# Patient Record
Sex: Female | Born: 1945
Health system: Southern US, Community
[De-identification: ages and names within clinical notes are randomized; demographics above are authoritative.]

## PROBLEM LIST (undated history)

## (undated) DIAGNOSIS — I10 Essential (primary) hypertension: Secondary | ICD-10-CM

## (undated) DIAGNOSIS — R42 Dizziness and giddiness: Secondary | ICD-10-CM

## (undated) DIAGNOSIS — I714 Abdominal aortic aneurysm, without rupture, unspecified: Secondary | ICD-10-CM

## (undated) DIAGNOSIS — Z720 Tobacco use: Secondary | ICD-10-CM

## (undated) DIAGNOSIS — M810 Age-related osteoporosis without current pathological fracture: Secondary | ICD-10-CM

## (undated) DIAGNOSIS — K219 Gastro-esophageal reflux disease without esophagitis: Secondary | ICD-10-CM

## (undated) DIAGNOSIS — E785 Hyperlipidemia, unspecified: Secondary | ICD-10-CM

## (undated) DIAGNOSIS — J449 Chronic obstructive pulmonary disease, unspecified: Secondary | ICD-10-CM

## (undated) HISTORY — PX: ABDOMINAL AORTIC ANEURYSM REPAIR: SUR1152

## (undated) HISTORY — DX: Hyperlipidemia, unspecified: E78.5

## (undated) HISTORY — DX: Dizziness and giddiness: R42

## (undated) HISTORY — DX: Age-related osteoporosis without current pathological fracture: M81.0

## (undated) HISTORY — DX: Tobacco use: Z72.0

## (undated) HISTORY — DX: Essential (primary) hypertension: I10

## (undated) HISTORY — DX: Chronic obstructive pulmonary disease, unspecified: J44.9

## (undated) HISTORY — DX: Gastro-esophageal reflux disease without esophagitis: K21.9

## (undated) HISTORY — DX: Abdominal aortic aneurysm, without rupture, unspecified: I71.40

## (undated) HISTORY — DX: Abdominal aortic aneurysm, without rupture: I71.4

---

## 2000-12-13 ENCOUNTER — Ambulatory Visit (HOSPITAL_COMMUNITY): Admission: RE | Admit: 2000-12-13 | Discharge: 2000-12-13 | Payer: Self-pay | Admitting: Neurosurgery

## 2000-12-13 ENCOUNTER — Encounter: Payer: Self-pay | Admitting: Neurosurgery

## 2001-01-08 ENCOUNTER — Encounter (HOSPITAL_COMMUNITY): Admission: RE | Admit: 2001-01-08 | Discharge: 2001-02-07 | Payer: Self-pay | Admitting: Neurosurgery

## 2001-06-04 ENCOUNTER — Encounter: Payer: Self-pay | Admitting: Neurosurgery

## 2001-06-04 ENCOUNTER — Ambulatory Visit (HOSPITAL_COMMUNITY): Admission: RE | Admit: 2001-06-04 | Discharge: 2001-06-04 | Payer: Self-pay | Admitting: Neurosurgery

## 2005-05-02 ENCOUNTER — Ambulatory Visit (HOSPITAL_COMMUNITY): Admission: RE | Admit: 2005-05-02 | Discharge: 2005-05-02 | Payer: Self-pay | Admitting: Ophthalmology

## 2005-05-23 ENCOUNTER — Ambulatory Visit (HOSPITAL_COMMUNITY): Admission: RE | Admit: 2005-05-23 | Discharge: 2005-05-23 | Payer: Self-pay | Admitting: Ophthalmology

## 2007-05-08 ENCOUNTER — Ambulatory Visit: Payer: Self-pay | Admitting: Cardiology

## 2008-01-15 ENCOUNTER — Ambulatory Visit: Payer: Self-pay | Admitting: Vascular Surgery

## 2009-01-20 ENCOUNTER — Ambulatory Visit: Payer: Self-pay | Admitting: Vascular Surgery

## 2010-01-26 ENCOUNTER — Ambulatory Visit: Payer: Self-pay | Admitting: Vascular Surgery

## 2010-10-08 ENCOUNTER — Inpatient Hospital Stay (HOSPITAL_COMMUNITY)
Admission: RE | Admit: 2010-10-08 | Discharge: 2010-10-17 | DRG: 238 | Disposition: A | Discharge status: Home Health | Payer: MEDICARE | Attending: Vascular Surgery | Admitting: Vascular Surgery

## 2010-10-08 DIAGNOSIS — I714 Abdominal aortic aneurysm, without rupture, unspecified: Principal | ICD-10-CM | POA: Diagnosis present

## 2010-10-08 DIAGNOSIS — K219 Gastro-esophageal reflux disease without esophagitis: Secondary | ICD-10-CM | POA: Diagnosis present

## 2010-10-08 DIAGNOSIS — F411 Generalized anxiety disorder: Secondary | ICD-10-CM | POA: Diagnosis present

## 2010-10-08 DIAGNOSIS — K560 Paralytic ileus: Secondary | ICD-10-CM | POA: Diagnosis present

## 2010-10-08 DIAGNOSIS — E876 Hypokalemia: Secondary | ICD-10-CM | POA: Diagnosis not present

## 2010-10-08 DIAGNOSIS — M549 Dorsalgia, unspecified: Secondary | ICD-10-CM | POA: Diagnosis present

## 2010-10-08 DIAGNOSIS — E785 Hyperlipidemia, unspecified: Secondary | ICD-10-CM | POA: Diagnosis present

## 2010-10-08 DIAGNOSIS — N390 Urinary tract infection, site not specified: Secondary | ICD-10-CM | POA: Diagnosis present

## 2010-10-08 DIAGNOSIS — I959 Hypotension, unspecified: Secondary | ICD-10-CM | POA: Diagnosis present

## 2010-10-08 LAB — ABO/RH: ABO/RH(D): O POS

## 2010-10-08 LAB — COMPREHENSIVE METABOLIC PANEL
AST: 28 U/L (ref 0–37)
BUN: 10 mg/dL (ref 6–23)
CO2: 22 mEq/L (ref 19–32)
Calcium: 8 mg/dL — ABNORMAL LOW (ref 8.4–10.5)
Chloride: 106 mEq/L (ref 96–112)
Creatinine, Ser: 0.9 mg/dL (ref 0.4–1.2)
GFR calc Af Amer: >60 mL/min (ref >60)
GFR calc non Af Amer: >60 mL/min (ref >60)
Glucose, Bld: 188 mg/dL — ABNORMAL HIGH (ref 70–99)
Total Bilirubin: 0.8 mg/dL (ref 0.3–1.2)

## 2010-10-08 LAB — DIFFERENTIAL
Basophils Absolute: 0 10*3/uL (ref 0.0–0.1)
Basophils Relative: 0 % (ref 0–1)
Eosinophils Absolute: 0.3 10*3/uL (ref 0.0–0.7)
Eosinophils Relative: 3 % (ref 0–5)
Lymphocytes Relative: 27 % (ref 12–46)
Lymphs Abs: 2.5 10*3/uL (ref 0.7–4.0)
Monocytes Absolute: 0.6 10*3/uL (ref 0.1–1.0)
Monocytes Relative: 6 % (ref 3–12)
Neutro Abs: 5.9 10*3/uL (ref 1.7–7.7)
Neutrophils Relative %: 64 % (ref 43–77)

## 2010-10-08 LAB — CBC
HCT: 44.1 % (ref 36.0–46.0)
HCT: 38.8 % (ref 36.0–46.0)
Hemoglobin: 15 g/dL (ref 12.0–15.0)
Hemoglobin: 12.9 g/dL (ref 12.0–15.0)
MCH: 30.4 pg (ref 26.0–34.0)
MCH: 30.2 pg (ref 26.0–34.0)
MCHC: 34 g/dL (ref 30.0–36.0)
MCHC: 33.2 g/dL (ref 30.0–36.0)
MCV: 89.5 fL (ref 78.0–100.0)
MCV: 90.9 fL (ref 78.0–100.0)
Platelets: 201 10*3/uL (ref 150–400)
RBC: 4.93 MIL/uL (ref 3.87–5.11)
RBC: 4.27 MIL/uL (ref 3.87–5.11)
RDW: 13 % (ref 11.5–15.5)
WBC: 9.3 10*3/uL (ref 4.0–10.5)

## 2010-10-08 LAB — GLUCOSE, CAPILLARY: Glucose-Capillary: 187 mg/dL — ABNORMAL HIGH (ref 70–99)

## 2010-10-08 LAB — BASIC METABOLIC PANEL
CO2: 24 mEq/L (ref 19–32)
Calcium: 9 mg/dL (ref 8.4–10.5)
Chloride: 109 mEq/L (ref 96–112)
GFR calc Af Amer: >60 mL/min (ref >60)
Glucose, Bld: 106 mg/dL — ABNORMAL HIGH (ref 70–99)
Sodium: 139 mEq/L (ref 135–145)

## 2010-10-08 LAB — APTT: aPTT: 39 seconds — ABNORMAL HIGH (ref 24–37)

## 2010-10-08 LAB — PROTIME-INR
INR: 1.06 (ref 0.00–1.49)
Prothrombin Time: 14 seconds (ref 11.6–15.2)

## 2010-10-08 LAB — MRSA PCR SCREENING: MRSA by PCR: NEGATIVE

## 2010-10-09 LAB — COMPREHENSIVE METABOLIC PANEL
ALT: 19 U/L (ref 0–35)
AST: 22 U/L (ref 0–37)
Albumin: 2.7 g/dL — ABNORMAL LOW (ref 3.5–5.2)
Calcium: 7.8 mg/dL — ABNORMAL LOW (ref 8.4–10.5)
Chloride: 105 mEq/L (ref 96–112)
Creatinine, Ser: 0.79 mg/dL (ref 0.4–1.2)
GFR calc Af Amer: >60 mL/min (ref >60)
Sodium: 134 mEq/L — ABNORMAL LOW (ref 135–145)
Total Bilirubin: 0.6 mg/dL (ref 0.3–1.2)

## 2010-10-09 LAB — GLUCOSE, CAPILLARY
Glucose-Capillary: 170 mg/dL — ABNORMAL HIGH (ref 70–99)
Glucose-Capillary: 125 mg/dL — ABNORMAL HIGH (ref 70–99)
Glucose-Capillary: 123 mg/dL — ABNORMAL HIGH (ref 70–99)

## 2010-10-09 LAB — CBC
Hemoglobin: 12.4 g/dL (ref 12.0–15.0)
MCH: 30.2 pg (ref 26.0–34.0)
Platelets: 128 10*3/uL — ABNORMAL LOW (ref 150–400)
RBC: 4.11 MIL/uL (ref 3.87–5.11)

## 2010-10-10 LAB — GLUCOSE, CAPILLARY
Glucose-Capillary: 149 mg/dL — ABNORMAL HIGH (ref 70–99)
Glucose-Capillary: 140 mg/dL — ABNORMAL HIGH (ref 70–99)
Glucose-Capillary: 140 mg/dL — ABNORMAL HIGH (ref 70–99)

## 2010-10-11 LAB — CBC
HCT: 31.8 % — ABNORMAL LOW (ref 36.0–46.0)
Hemoglobin: 10.5 g/dL — ABNORMAL LOW (ref 12.0–15.0)
MCH: 30.3 pg (ref 26.0–34.0)
MCHC: 33 g/dL (ref 30.0–36.0)
MCV: 91.9 fL (ref 78.0–100.0)

## 2010-10-11 LAB — BASIC METABOLIC PANEL
CO2: 26 mEq/L (ref 19–32)
Chloride: 102 mEq/L (ref 96–112)
GFR calc non Af Amer: >60 mL/min (ref >60)
Glucose, Bld: 133 mg/dL — ABNORMAL HIGH (ref 70–99)
Potassium: 4.6 mEq/L (ref 3.5–5.1)
Sodium: 134 mEq/L — ABNORMAL LOW (ref 135–145)

## 2010-10-11 LAB — GLUCOSE, CAPILLARY
Glucose-Capillary: 119 mg/dL — ABNORMAL HIGH (ref 70–99)
Glucose-Capillary: 122 mg/dL — ABNORMAL HIGH (ref 70–99)
Glucose-Capillary: 136 mg/dL — ABNORMAL HIGH (ref 70–99)

## 2010-10-12 LAB — CROSSMATCH
ABO/RH(D): O POS
Antibody Screen: NEGATIVE
Unit division: 0
Unit division: 0
Unit division: 0
Unit division: 0
Unit division: 0
Unit division: 0
Unit division: 0
Unit division: 0

## 2010-10-12 LAB — GLUCOSE, CAPILLARY
Glucose-Capillary: 146 mg/dL — ABNORMAL HIGH (ref 70–99)
Glucose-Capillary: 155 mg/dL — ABNORMAL HIGH (ref 70–99)

## 2010-10-13 LAB — GLUCOSE, CAPILLARY: Glucose-Capillary: 113 mg/dL — ABNORMAL HIGH (ref 70–99)

## 2010-10-13 LAB — URINE MICROSCOPIC-ADD ON

## 2010-10-13 LAB — URINALYSIS, ROUTINE W REFLEX MICROSCOPIC
Specific Gravity, Urine: 1.006 (ref 1.005–1.030)
Urine Glucose, Fasting: NEGATIVE mg/dL

## 2010-10-14 LAB — GLUCOSE, CAPILLARY
Glucose-Capillary: 108 mg/dL — ABNORMAL HIGH (ref 70–99)
Glucose-Capillary: 109 mg/dL — ABNORMAL HIGH (ref 70–99)
Glucose-Capillary: 112 mg/dL — ABNORMAL HIGH (ref 70–99)
Glucose-Capillary: 140 mg/dL — ABNORMAL HIGH (ref 70–99)

## 2010-10-15 LAB — GLUCOSE, CAPILLARY
Glucose-Capillary: 108 mg/dL — ABNORMAL HIGH (ref 70–99)
Glucose-Capillary: 124 mg/dL — ABNORMAL HIGH (ref 70–99)
Glucose-Capillary: 128 mg/dL — ABNORMAL HIGH (ref 70–99)
Glucose-Capillary: 131 mg/dL — ABNORMAL HIGH (ref 70–99)
Glucose-Capillary: 138 mg/dL — ABNORMAL HIGH (ref 70–99)

## 2010-10-15 LAB — URINALYSIS, MICROSCOPIC ONLY
Bilirubin Urine: NEGATIVE
Hgb urine dipstick: NEGATIVE
Protein, ur: NEGATIVE mg/dL
Urobilinogen, UA: 4 mg/dL — ABNORMAL HIGH (ref 0.0–1.0)

## 2010-10-16 LAB — CBC
Hemoglobin: 10.5 g/dL — ABNORMAL LOW (ref 12.0–15.0)
RBC: 3.51 MIL/uL — ABNORMAL LOW (ref 3.87–5.11)
WBC: 7.3 10*3/uL (ref 4.0–10.5)

## 2010-10-16 LAB — GLUCOSE, CAPILLARY
Glucose-Capillary: 117 mg/dL — ABNORMAL HIGH (ref 70–99)
Glucose-Capillary: 117 mg/dL — ABNORMAL HIGH (ref 70–99)
Glucose-Capillary: 129 mg/dL — ABNORMAL HIGH (ref 70–99)

## 2010-10-16 LAB — BASIC METABOLIC PANEL
CO2: 22 mEq/L (ref 19–32)
Calcium: 8.5 mg/dL (ref 8.4–10.5)
Chloride: 108 mEq/L (ref 96–112)
GFR calc Af Amer: >60 mL/min (ref >60)
Potassium: 2.9 mEq/L — ABNORMAL LOW (ref 3.5–5.1)
Sodium: 140 mEq/L (ref 135–145)

## 2010-10-17 LAB — URINE CULTURE: Culture  Setup Time: 201202041709

## 2010-10-17 LAB — BASIC METABOLIC PANEL
CO2: 22 mEq/L (ref 19–32)
Calcium: 8.8 mg/dL (ref 8.4–10.5)
Creatinine, Ser: 0.85 mg/dL (ref 0.4–1.2)
GFR calc Af Amer: >60 mL/min (ref >60)

## 2010-10-19 NOTE — Discharge Summary (Addendum)
NAMECURRY, [name]                ACCOUNT NO.:  [id number]  MEDICAL RECORD NO.:  [id number]           PATIENT TYPE:  I  LOCATION:  2010                         FACILITY:  [facility]  PHYSICIAN:  [name], M.D.    DATE OF BIRTH:  12/22/1945  DATE OF ADMISSION:  10/08/2010 DATE OF DISCHARGE:  10/17/2010                              DISCHARGE SUMMARY   ADMITTING DIAGNOSIS:  Symptomatic abdominal aortic aneurysm.  PAST MEDICAL HISTORY AND DISCHARGE DIAGNOSES: 1. Gastroesophageal reflux disease. 2. Hyperlipidemia. 3. Anxiety. 4. Symptomatic abdominal aortic aneurysm, status post abdominal aortic     aneurysm repair with a 16-mm Hemashield graft. 5. History of C-section. 6. History of cholecystectomy. 7. History of lumbar surgery. 8. Urinary tract infection, [name], sensitive to Cipro.  ALLERGIES:  No known drug allergies.  BRIEF HISTORY:  The patient is a 65 year old Caucasian female with a known small infrarenal abdominal aortic aneurysm followed by serial ultrasounds.  She presented to [facility] on October 08, 2010, with severe progressive back pain and a CT scan showing a 5.3-cm aneurysm.  Due to the rapid expansion and abdominal tenderness on exam, it was recommended the patient undergo urgent surgical repair.  There was no evidence of leak at that time.  HOSPITAL COURSE:  The patient was admitted to the emergency room secondary to abdominal pain and an expanding aneurysm as previously stated.  The patient was taken to the OR on October 08, 2010 for resection and grafting of abdominal aortic aneurysm with a 16-mm Hemashield graft.  The patient tolerated the procedure well and was hemodynamically stable immediately postoperatively.  The patient was transferred from the OR to the SICU in stable condition.  The patient was extubated without complication and woke up from anesthesia neurologically intact.  The patient's postoperative course has progressed  as expected with the exception of postoperative ileus.  She has ambulated well and without difficulty.  Her diet has been slowly advanced.  She did have several episodes of emesis initially and the diet was reverted back to liquids only.  She eventually began to have bowel movements with the aid of suppositories and milk of magnesia.  Her ileus has improved and she is currently tolerating a regular diet again.  The patient does have a urinary tract infection as well.  The culture was positive and she is being treated with culture-appropriate Cipro.  She has been asymptomatic from this standpoint.   On postop day #9 the patient is without complaint.  She is ambulating well and tolerating a regular diet.  She has had multiple bowel movements.  She is afebrile with stable vital signs. Physical exam:  cardiac is regular rate and rhythm, lungs are clear to auscultation, abdomen is soft with active  bowel sounds, the incision is clean, dry, and intact. There are 2+ posterior tibial pulses present bilaterally.    The patient is doing well at this time.  She will need to complete a 7-day course of Cipro for her urinary tract infection.  The patient did have hypokalemia yesterday.  As long as her repeat potassium level is within normal limits  she will be stable for discharge home at this time.  CBC and BMP on October 16, 2010:  White count 7.3, hemoglobin 10.5, platelets 265, sodium 140, potassium 2.9, BUN 3, and creatinine 0.78.  The patient received specific written discharge instructions regarding diet, activity, and wound care.  She is to call the office with any questions issues or problems at [phone].  The patient will have a followup appointment with Dr. [name] in approximately 2 weeks.  The office will contact the patient with date and time of the appointment.  DISCHARGE MEDICATIONS: 1. Cipro 500 mg 1 p.o. daily for an additional 5 days. 2. Hydrocodone/acetaminophen 5/325 mg 1-2 q.4-6  h. p.r.n. pain. 3. Diazepam 10 mg 1-1/2 tablets p.o. nightly p.r.n. 4. Ranitidine 150 mg 1 p.o. b.i.d. 5. Simvastatin 20 mg 1 p.o. nightly. 6. Vitamin D OTC 1 p.o. daily.     [name], PA   ______________________________ [name], M.D.    AY/MEDQ  D:  10/17/2010  T:  10/17/2010  Job:  [id number]  Electronically Signed by [name] PA on 10/18/2010 01:24:56 PM Electronically Signed by [name] M.D. on 10/19/2010 11:24:48 AM

## 2010-10-19 NOTE — H&P (Addendum)
[name]                ACCOUNT NO.:  [id number]  MEDICAL RECORD NO.:  [id number]          PATIENT TYPE:  INP  LOCATION:  2305                         FACILITY:  [facility]  PHYSICIAN:  [name], M.D.    DATE OF BIRTH:  March 05, 1946  DATE OF ADMISSION:  10/08/2010 DATE OF DISCHARGE:                             HISTORY & PHYSICAL   ADMISSION DIAGNOSIS:  Symptomatic abdominal aortic aneurysm.  HISTORY OF PRESENT ILLNESS:  [name] is a 65 year old white female with a known history of infrarenal abdominal aortic aneurysm.  She presented to the [facility] morning of this admission with increasingly severe back pain.  This had been progressive over the past 2 weeks and got to the point where she was unable to tolerate this and presented to the emergency room.  Evaluation at [facility] Emergency Department included a CAT scan, which showed her aneurysm to be 5.3 cm with no evidence of leak.  This began just below the level of renal arteries and ended at the aortic bifurcation.  She had been seen in our office by Dr. Darrick Penna in 2009 and had had serial ultrasound exam since that time.  In May 2011, her aneurysm size was 3.9 cm and had been 3.7 cm on the prior exam 1 year earlier.  Her past history is negative for cardiac disease.  She does have a history of gastroesophageal reflux disease, hyperlipidemia and anxiety.  SOCIAL HISTORY:  She does smoke one pack per day cigarettes.  PAST SURGICAL HISTORY:  Positive for C-section, cholecystectomy, and lumbar surgery 10 years ago.  ALLERGIES:  No known drug allergies.  REVIEW OF SYSTEMS:  Documented in her ER chart and is negative aside from HPI.  FAMILY HISTORY:  Negative for abdominal aortic aneurysm.  PHYSICAL EXAMINATION:  GENERAL:  Well-developed, well-nourished white female in mild distress from back pain.  She is alert and oriented. VITAL SIGNS:  Blood pressure 152/85, pulse 88, respirations 18, her oxygen  saturations are 95% on 2 liters nasal cannula. HEENT:  Normal. CHEST:  Clear bilaterally. HEART:  Regular rate and rhythm without murmur.  Her radial, femoral, popliteal and dorsalis pedis pulses are all 2+ and equal. ABDOMEN:  Reveals easily palpable aneurysm.  She is tender specifically over the aneurysm.  No other masses are noted. EXTREMITIES:  No cyanosis, clubbing or edema.  No major deformities. NEUROLOGIC:  Neurologically, she is grossly intact. SKIN:  Without ulcerations or rashes.  LABORATORY DATA:  Creatinine is 0.9.  Hemoglobin and hematocrit are 14 and 42.  EKG is normal sinus rhythm.  CT scan was reviewed, this did show a aneurysm beginning just below the level of the renal arteries extending to the bifurcation and is 5.3 cm with irregular border.  There is no evidence of leak.  I had a long discussion with the patient and her family present, I explained that she has a large aneurysm with very rapid growth of 1.4 cm over the past 8 months, this an association with her worsening, unrelenting back pain and tenderness over the aneurysm warrants urgent repair.  I have recommend that we proceed to  the operating room this morning from the ER for urgent open repair.  I explained the option of stent grafting and if this was not possible for her due to the aneurysm beginning just below the takeoff of the renal arteries, I explained the procedure of open aneurysm repair with a predicted less than 5% mortality from the surgery and a 10-15% major morbidity.  I explained expected hospitalization of 1-2 days an intensive care and expected 5-7 days hospitalizations if she does not have complications.  She and her family understand and wish to proceed with urgent surgery today.     [name], M.D.     TFE/MEDQ  D:  10/08/2010  T:  10/08/2010  Job:  [id number]  Electronically Signed by Roosevelt Bisher M.D. on 10/19/2010 11:24:33 AM

## 2010-10-19 NOTE — Op Note (Addendum)
[name], [name]                ACCOUNT NO.:  [id number]  MEDICAL RECORD NO.:  [id number]          PATIENT TYPE:  INP  LOCATION:  2305                         FACILITY:  [facility]  PHYSICIAN:  [name], M.D.    DATE OF BIRTH:  [birth year]  DATE OF PROCEDURE:  10/08/2010 DATE OF DISCHARGE:                              OPERATIVE REPORT   PREOPERATIVE DIAGNOSIS:  Symptomatic infrarenal abdominal aortic aneurysm.  POSTOPERATIVE DIAGNOSIS:  Symptomatic infrarenal abdominal aortic aneurysm.  PROCEDURE:  Resection grafting of abdominal aortic aneurysm with a 16-mm Hemashield graft.  SURGEON:  [name], MD  ASSISTANT:  Nurse  ANESTHESIA:  General endotracheal.  COMPLICATIONS:  None.  Discharged to recovery room stable.  INDICATION FOR THE PROCEDURE:  The patient is a 65 year old female with a known small infrarenal abdominal aortic aneurysm followed with serial ultrasound.  She presented to [facility] with severe progressive back pain, CT scan showing a 5.3-cm aneurysm.  Due to the rapid expansion and due to abdominal tenderness on physical exam, it was recommended that she undergo urgent repair.  There was no evidence of leak.  PROCEDURE IN DETAIL:  The patient was taken to the operating room, placed in supine position where the area of abdomen and both groins was prepped and draped in usual sterile fashion.  Incision was made from the level of xiphoid to below the umbilicus and carried down through the midline fascia with electrocautery.  The patient had a prior open cholecystectomy, and there were adhesions to the anterior abdominal wall on the right and these were all lysed.  Transverse colon and omentum were reflected superiorly and the small bowel was reflected to the right.  Omni-Tract retractor was used for exposure.  The retroperitoneum was opened.  The duodenum was mobilized off the aorta.  The aorta was encircled just below the level of the renal  arteries.  Renal arteries were identified on the right and left.  Dissection was carried down to the bifurcation of the aorta, and there was no evidence of aneurysm extending into the iliac arteries.  The common iliac arteries were encircled bilaterally.  The inferior mesenteric artery was controlled. The patient was given 25 g of mannitol and 8000 units of intravenous heparin.  After adequate circulation time, the aorta was occluded with a Harken clamp just below the level of the renal arteries.  The common iliac arteries were occluded bilaterally with Henley clamps.  The artery was opened longitudinally with electrocautery.  Lumbar back bleeders were controlled with 2-0 silk figure-of-eight sutures.  The aorta was transected below the level of the proximal clamp and also was transected just above the bifurcation of the iliac.  A 16-mm Hemashield graft was brought onto the field.  Using a felt strip for reinforcement, the graft was sewn end-to-end to the proximal aorta with a running 3-0 Prolene suture.  This anastomosis was tested and found to be adequate.  The graft was then cut to appropriate length and was sewn end-to-end to the distal aorta just above the aortic bifurcation.  This also was with a running 3-0 Prolene suture.  Prior  to completion of the anastomosis, the usual flushing maneuvers were undertaken and that was completed.  Flow was restored first to the left and then the right iliac.  The patient was given 50 mg of protamine to reverse heparin.  Wounds were irrigated with saline.  The aneurysm wall was closed over the aneurysm sac with a running 2-0 Vicryl suture.  The retroperitoneum was then closed with a running 2-0 Vicryl suture.  The small bowel was run in its entirety and found to be without injury and placed back in the pelvis, the transverse colon and omentum were placed over this.  The midline fascia was closed with a #1 PDS suture beginning proximally and distally  and tying in the middle.  The wounds again were irrigated and hemostasis was obtained with electrocautery, and the skin incision was closed with 3-0 Vicryl in the subcutaneous tissue.  Sterile dressing was applied, Benzoin and Steri-Strips were applied prior to this, and the patient was transferred to the recovery room extubated in stable condition.     [name], M.D.     TFE/MEDQ  D:  10/08/2010  T:  10/09/2010  Job:  [id number]  Electronically Signed by TODD EARLY M.D. on 10/19/2010 11:24:42 AM

## 2010-11-01 ENCOUNTER — Ambulatory Visit (INDEPENDENT_AMBULATORY_CARE_PROVIDER_SITE_OTHER): Payer: MEDICARE | Admitting: Vascular Surgery

## 2010-11-01 ENCOUNTER — Encounter (INDEPENDENT_AMBULATORY_CARE_PROVIDER_SITE_OTHER): Payer: MEDICARE

## 2010-11-01 DIAGNOSIS — Z48812 Encounter for surgical aftercare following surgery on the circulatory system: Secondary | ICD-10-CM

## 2010-11-01 DIAGNOSIS — I714 Abdominal aortic aneurysm, without rupture: Secondary | ICD-10-CM

## 2010-11-02 NOTE — Assessment & Plan Note (Signed)
OFFICE VISIT  [name], [name] DOB:  18-Sep-1945                                       11/01/2010 DGLOV#:[id number]  The patient presents today for followup of her resection grafting of symptomatic abdominal aortic aneurysm on 10/08/2010.  She had severe progressive back pain and a CT scan showing progression to a 5.3-cm aneurysm.  Her prior study in May had shown a maximal diameter of 3.9 cm.  Due to the rapid expansion, it was recommended she undergo urgent repair.  She did well in the hospital, did have some significant constipation.  Since discharge, she does have the usual amount of poor diminished appetite, although she is able to eat and also poor bowel function.  Her stamina is improving.  She continues to have her chronic back pain.  She did have prior disk surgery in the past.  Her abdominal exam is well-healed.  She has 2+ femoral pulses bilaterally.  Her noninvasive vascular laboratory studies revealed normal ankle-arm indices bilaterally.  I am pleased with her initial result.  I encouraged her to continue her activity as tolerated.  She was given a prescription for Vicodin 5/500, #30 for ongoing pain.  We will see her again in 2 months for continued followup.    [name], M.D.  TFE/MEDQ  D:  11/01/2010  T:  11/02/2010  Job:  [id number]

## 2010-11-29 ENCOUNTER — Ambulatory Visit: Payer: MEDICARE | Admitting: Vascular Surgery

## 2011-01-03 ENCOUNTER — Ambulatory Visit (INDEPENDENT_AMBULATORY_CARE_PROVIDER_SITE_OTHER): Payer: Medicare Other | Admitting: Vascular Surgery

## 2011-01-03 DIAGNOSIS — I714 Abdominal aortic aneurysm, without rupture, unspecified: Secondary | ICD-10-CM

## 2011-01-03 NOTE — Assessment & Plan Note (Signed)
OFFICE VISIT  KIM, OKI DOB:  1946/08/31                                       01/03/2011 ZOXWR#:[id number]  The patient presents today for followup after emergent repair of her abdominal aortic aneurysm on 10/08/2010.  She has returned to her baseline health.  She reports that she has had marked improvement in her back pain and is not taking narcotics any more for this which she had been taking chronically before her surgery.  She has normal return of bowel function and appetite.  Her abdominal incision is well-healed without any evidence of hernia and she does have palpable pedal pulses bilaterally.  Her only new complaint is a balance issue and she will discuss this with Dr. Leandrew Koyanagi.  I plan to see her again on a p.r.n. basis.  She knows to notify us should she have any difficulty related to her surgery.    [name], M.D. Electronically Signed  TFE/MEDQ  D:  01/03/2011  T:  01/03/2011  Job:  [id number]  cc:   Dr Margot Ables

## 2011-01-24 NOTE — Procedures (Signed)
DUPLEX ULTRASOUND OF ABDOMINAL AORTA   INDICATION:  Abdominal aortic aneurysm.   HISTORY:  Diabetes:  No.  Cardiac:  No.  Hypertension:  No.  Smoking:  Yes.  Connective Tissue Disorder:  Family History:  No.  Previous Surgery:  History of gallbladder surgery.   DUPLEX EXAM:         AP (cm)                   TRANSVERSE (cm)  Proximal             2.97 cm                   2.8 cm  Mid                  3.2 cm                    3.2 cm  Distal               3.7 cm                    3.5 cm  Right Iliac          1.5 cm                    1.5 cm  Left Iliac           1.1 cm                    1.1 cm   PREVIOUS:  Date:  AP:  TRANSVERSE:   IMPRESSION:  Aneurysmal dilatation of the mid to distal abdominal aorta,  based on limited visualization due to overlying bowel gas patterns.   ___________________________________________  Janetta Hora Fields, MD   CH/MEDQ  D:  01/20/2009  T:  01/20/2009  Job:  [id number]

## 2011-01-24 NOTE — Procedures (Signed)
DUPLEX ULTRASOUND OF ABDOMINAL AORTA   INDICATION:  Abdominal aortic aneurysm.   HISTORY:  Diabetes:  No.  Cardiac:  No.  Hypertension:  No.  Smoking:  Yes.  Connective Tissue Disorder:  Family History:  No.  Previous Surgery:  No.   DUPLEX EXAM:         AP (cm)                   TRANSVERSE (cm)  Proximal             2.6 cm                    2.7 cm  Mid                  3.9 cm                    3.6 cm  Distal               3.8 cm                    3.6 cm  Right Iliac          1.3 cm                    1.4 cm  Left Iliac           1.2 cm                    1.3 cm   PREVIOUS:  Date:  AP:  3.7  TRANSVERSE:  3.5   IMPRESSION:  1. Abdominal aortic aneurysm with the largest diameter of 3.9 cm x 3.6      cm.  2. Somewhat limited due to overlying bowel gas patterns.   ___________________________________________  Janetta Hora Fields, MD   NT/MEDQ  D:  01/26/2010  T:  01/26/2010  Job:  [id number]

## 2011-01-24 NOTE — Assessment & Plan Note (Signed)
OFFICE VISIT   GLENDON, FISER  DOB:  1946-07-04                                       01/15/2008  ZOXWR#:[id number]   The patient is a 65 year old female referred by Dr. Selinda Flavin for  evaluation of an abdominal aortic aneurysm.  She has had a known small  abdominal aortic aneurysm for several years.  Her last ultrasound showed  a 2.6 cm abdominal aortic aneurysm in November of 2006.  She recently  had a repeat ultrasound for surveillance which showed the aneurysm had  grown to 3.6 cm in size.  She has a history of chronic back pain.  She  denies any abdominal pain.  She states that her back pain has not  changed recently.  She has no family history of abdominal aortic  aneurysm.  Her atherosclerotic risk factors include elevated  cholesterol.  She also has a history smoking and currently smokes 1-1/2  pack of cigarettes per day.  She denies history of coronary artery  disease, diabetes, or hypertension.   PAST SURGICAL HISTORY:  Remarkable for a back operation in 2002.  She  also had cataract removals.   FAMILY HISTORY:  Is unremarkable.   SOCIAL HISTORY:  She is married and has three children.  Smoking history  is as above.  She does not consume alcohol regularly.   REVIEW OF SYSTEMS:  She is 5 feet 7 inches, 172 pounds.  Cardiac, GI, GU, vascular, neurologic, and hematologic review of systems  are all negative.  She does have a history of shortness of breath at less than 2 flights of  stairs.  She is not on home oxygen.  She denies history of asthma or  wheezing.  She also has a history of multiple joint arthritis, some anxiety, and  decline in her eyesight secondary to her cataracts.   Medications include diazepam 10 mg 1-1/2 tablets daily at bedtime,  simvastatin 20 mg once a day, Tylenol p.r.n.  She has no known drug  allergies.   PHYSICAL EXAM:  Blood pressure is 138/91 in the left arm, heart rate is  101 regular.  HEENT is unremarkable.   She has 2+ carotid pulses without  bruit.  Chest  has coarse breath sounds in the bases bilaterally.  Cardiac exam is regular rate and rhythm without murmur.  Abdomen is  soft, nontender with no palpable pulsatile mass.  She has 2+ radial and  femoral pulses bilaterally.  She has 1+ popliteal pulses bilaterally.  She has 2+ dorsalis pedis and posterior tibial pulses in her feet  bilaterally.   Ultrasound of abdominal aorta from [facility] dated April 1st,  2009 is reviewed and shows that she has an infrarenal abdominal aortic  aneurysm measuring 3.6x3.6 cm in diameter.  Right common iliac artery is  1.34 cm and the left was 0.97 cm.   The patient has a small abdominal aortic aneurysm that currently is not  at very high risk for rupture.  I believe she should continue to be  followed by serial ultrasounds.  If the aneurysm ever reaches a size of  5 to 5-1/2 cm in diameter, we would consider repair at that time.  I  have started her on one aspirin daily today due to multiple risk factors  for atherosclerotic occlusive disease.  We will repeat the ultrasound of  her  abdominal aortic aneurysm in one year's time.   Janetta Hora. Fields, MD  Electronically Signed   CEF/MEDQ  D:  01/16/2008  T:  01/17/2008  Job:  [id number]

## 2011-01-27 NOTE — Op Note (Signed)
[place]  Patient:    [name], [name]                       MRN: [id number] Proc. Date: 12/13/00 Adm. Date:  [id number] Attending:  Gerald Dexter                           Operative Report  PREOPERATIVE DIAGNOSIS:  Herniated disk L3-4 left.  POSTOPERATIVE DIAGNOSIS:  Herniated disk L3-4 left.  PROCEDURES: 1. Left L3-4 intralaminar laminotomy for excision of herniated disk with    operating microscope. 2. Microdissection L3-4 disk and L4 nerve root.  SURGEON:  Reinaldo Meeker, M.D.  ASSISTANT:  Donzetta Sprung. Roney Jaffe., M.D.  PROCEDURE IN DETAIL:  After being placed in the prone position, the patients back was prepped and draped in usual sterile fashion.  Localizing x-ray was taken prior to incision to identify the L3-4 level.  Midline incision made above the spinous processes of L3 and L4.  Using the Bovie cutting current, the incision was carried down the spinous processes.  Subperiosteal dissection was then carried out along the left side of spinous processes and lamina and McCullough self-retaining retractor was placed for exposure.  A second x-ray was taken to confirm approach at the L3-4 level and this was correct.  Using the high-speed drill, the inferior one-half of the L3 lamina and the medial one-third of the facet joint were removed.  The drill was then used to remove the superior one-half of the L4 lamina.  Residual bone and ligamentum flavum were removed in a piecemeal fashion.  The microscope was draped and brought into the field and used for the remainder of the case.  Using microdissection technique, the lateral aspect of the thecal sac and L4 nerve root were identified.  Coagulation under the nerve root showed the inferior fragment of disk.  A small sheath was incised and the disk material removed.  This gave excellent decompression of the L4 nerve root.  The disk space was then incised with a 15-blade and thoroughly cleaned  out with pituitary rongeurs and curets.  At this point, inspection was carried out in all directions for any evidence of residual compression and none could be identified.  Large amounts of irrigation were carried out and any bleeding controlled with bipolar coagulation and Gelfoam.  The wound was then closed using interrupted Vicryl on the muscle, fascia, subcutaneous and subcuticular tissues and staples on the skin.  Sterile dressing was then applied.  Patient was extubated and taken to recovery room in stable condition. DD:  12/13/00 TD:  12/13/00 Job: [id number]

## 2014-08-10 ENCOUNTER — Encounter: Payer: Self-pay | Admitting: Vascular Surgery

## 2014-08-31 ENCOUNTER — Encounter: Payer: Self-pay | Admitting: Vascular Surgery

## 2014-09-01 ENCOUNTER — Ambulatory Visit (INDEPENDENT_AMBULATORY_CARE_PROVIDER_SITE_OTHER): Payer: Medicare HMO | Admitting: Vascular Surgery

## 2014-09-01 VITALS — BP 151/85 | HR 83 | Resp 16 | Ht 67.0 in | Wt 173.0 lb

## 2014-09-01 DIAGNOSIS — I714 Abdominal aortic aneurysm, without rupture, unspecified: Secondary | ICD-10-CM

## 2014-09-01 NOTE — Progress Notes (Signed)
Established Abdominal Aortic Aneurysm  History of Present Illness  The patient is a 68 y.o. (07-06-1946) female who presents with chief complaint: follow up for new thoracic aortic finding on CTA at [facility] ED.  On 08/31/2014 she had a dizzy spell and passed out at home.  Her husband called EMS.  She states she was in the process of calling her primary care Physician's office to report high BP readings 250/100+ readings at home.   Previous studies demonstrate an AAA, measuring 4.2 cm x 3.4 cm.  The patient does not have back or abdominal pain.  The patient is a smoker.  Past medical history includes:hyperlipidemia, hypertension, and acid reflux.  She currently takes a statin daily, but is not on hypertensive medications to date.     Review of Systems  Constitutional: Negative for fever, chills and weight loss.  HENT: Negative for congestion, sore throat and tinnitus.   Eyes: Negative for blurred vision and double vision.  Respiratory: Positive for cough and wheezing.   Cardiovascular: Negative for chest pain, palpitations and leg swelling.  Gastrointestinal: Negative for heartburn, nausea and vomiting.  Genitourinary: Negative for urgency and frequency.  Musculoskeletal: Positive for joint pain. Negative for back pain and neck pain.  Skin: Negative for rash.  Neurological: Positive for dizziness. Negative for tremors, sensory change and headaches.  Psychiatric/Behavioral: Negative for depression and memory loss.     Physical Examination  Filed Vitals:   09/01/14 1316 09/01/14 1320  BP: 143/82 151/85  Pulse: 82 83  Resp: 16   Height: 5\' 7"  (1.702 m)   Weight: 173 lb (78.472 kg)    Body mass index is 27.09 kg/(m^2).  General: A&O x 3, WDWN NAD  Pulmonary: Sym exp, good air movt, CTAB, no rales, rhonchi,  + inspiratory wheezing,  Cardiac: RRR, Nl S1, S2, no Murmurs, rubs or gallops Vascular: Vessel Right Left  Radial Palpable Palpable  Brachial Palpable Palpable    Carotid Palpable, without bruit Palpable, without bruit  Aorta Not palpable N/A  Femoral Palpable Palpable  Popliteal Not palpable Not palpable  PT Palpable Palpable  DP Palpable Palpable   Gastrointestinal: soft, NTND, -G/R, - HSM, - masses, well healed AAA incision site  Musculoskeletal: M/S 5/5 throughout  Extremities without ischemic changes   Neurologic: CN 2-12 intact, Pain and light touch intact in extremities, Motor exam as listed above  Non-Invasive Vascular Imaging  AAA Duplex (09/01/2014)  Previous size: 4.0 x 3 cm cm (Date: 10/07/2010)  Current size:  4.2 x 3.4 cm (Date: 07/29/2014)  CTA lower thoracic aorta with focal bulge above hiatus 4.3 x 3.6 cm  Medical Decision Making  The patient is a 68 y.o. female who presents with: asymptomatic s/p repaired  AAA with minimal increasing in size size.  Thoracic aorta 4.2 x 3.6 cm.     Based on this patient's exam and diagnostic studies, she is stable.  We advice her to follow up with her primary care physician Dr. Woody Seller in regards to her BP.  She will follow up with [place] in one ear for a follow up CTA abdomin and pelvis.   The threshold for repair is would be > 6.0 cm change in the thoracic aorta/ AAA post repair.  She was seen by Dr. Donnetta Hutching today in clinic.   [name] [facility] PA-C [facility] [place] Office: [phone]   09/01/2014, 1:47 PM  I have examined the patient, reviewed and agree with above.  EARLY, TODD, MD  09/01/2014 4:04 PM I reviewed the CT scan films from yesterday and also compare these to the November 2015 study. She does have some moderate atherosclerotic changes of the thoracic aorta but no evidence of aneurysm. 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 Maximal diameter here is 4.2 is compared to 3.7 and her former study in 2012. The area of prior open repair of her infrarenal aneurysm looks quite good with no evidence of pseudoaneurysm or other  problems. On discussion with patient and her husband explaining that this has a minimal risk for rupture at the 4.2 cm. Did explain that we will need to follow this with CT scan to rule out progression. Explain the difficulty in repairing aneurysms at this location. She will continue to follow with her medical doctor regarding her severe hypertension.

## 2014-09-02 ENCOUNTER — Other Ambulatory Visit: Payer: Self-pay

## 2014-09-02 DIAGNOSIS — I714 Abdominal aortic aneurysm, without rupture, unspecified: Secondary | ICD-10-CM

## 2015-09-03 ENCOUNTER — Other Ambulatory Visit: Payer: Self-pay | Admitting: *Deleted

## 2015-09-03 DIAGNOSIS — Z01812 Encounter for preprocedural laboratory examination: Secondary | ICD-10-CM

## 2015-09-07 ENCOUNTER — Ambulatory Visit: Payer: Medicare Other | Admitting: Vascular Surgery

## 2015-09-07 ENCOUNTER — Ambulatory Visit
Admission: RE | Admit: 2015-09-07 | Discharge: 2015-09-07 | Disposition: A | Discharge status: Home / Self Care | Payer: Medicare HMO | Source: Ambulatory Visit | Attending: Vascular Surgery | Admitting: Vascular Surgery

## 2015-09-07 ENCOUNTER — Encounter: Payer: Self-pay | Admitting: Vascular Surgery

## 2015-09-07 DIAGNOSIS — I714 Abdominal aortic aneurysm, without rupture, unspecified: Secondary | ICD-10-CM

## 2015-09-07 MED ORDER — IOPAMIDOL (ISOVUE-370) INJECTION 76%
80.0000 mL | Freq: Once | INTRAVENOUS | Status: AC | PRN
  Administered 2015-09-07: 80 mL via INTRAVENOUS

## 2015-09-14 ENCOUNTER — Encounter: Payer: Self-pay | Admitting: Vascular Surgery

## 2015-09-14 ENCOUNTER — Ambulatory Visit (INDEPENDENT_AMBULATORY_CARE_PROVIDER_SITE_OTHER): Payer: Medicare HMO | Admitting: Vascular Surgery

## 2015-09-14 VITALS — BP 138/57 | HR 66 | Temp 97.7°F | Resp 20 | Ht 67.0 in | Wt 173.2 lb

## 2015-09-14 DIAGNOSIS — I714 Abdominal aortic aneurysm, without rupture, unspecified: Secondary | ICD-10-CM

## 2015-09-14 DIAGNOSIS — I712 Thoracic aortic aneurysm, without rupture, unspecified: Secondary | ICD-10-CM

## 2015-09-14 NOTE — Progress Notes (Signed)
[facility]  Patient name: [name] MRN: [id number] DOB: [birth year] Sex: female  REASON FOR VISIT: Follow-up of thoracic aneurysm  HPI: [name] is a 70 y.o. female known to me from prior urgent repair of infrarenal abdominal aortic aneurysm which was causing pain and rapid expansion on 10/08/2010. I saw her proximal one year ago. She had undergone CT scan for other reasons and was found to have ectasia and irregularity of her descending thoracic aorta. I recommended the yearly repeat CT scan and she is here today for discussion of this. She does have better control of her hypertension with additional meds. She does have chronic back pain associated with the degenerative disc disease. No new major medical problems and no new cardiac difficulty  Past Medical History  Diagnosis Date  . Hypertension   . GERD (gastroesophageal reflux disease)   . AAA (abdominal aortic aneurysm) ([place])   . Moderate COPD (chronic obstructive pulmonary disease) ([place])   . Osteoporosis   . Hyperlipidemia   . Vertigo   . Tobacco abuse     History reviewed. No pertinent family history.  SOCIAL HISTORY: Social History  Substance Use Topics  . Smoking status: Current Every Day Smoker -- 1.00 packs/day for 50 years    Types: Cigarettes  . Smokeless tobacco: Not on file  . Alcohol Use: Not on file    Allergies  Allergen Reactions  . Tramadol Itching    Current Outpatient Prescriptions  Medication Sig Dispense Refill  . albuterol (PROVENTIL HFA;VENTOLIN HFA) 108 (90 BASE) MCG/ACT inhaler Inhale 2 puffs into the lungs every 6 (six) hours as needed for wheezing or shortness of breath.    . metoprolol succinate (TOPROL-XL) 50 MG 24 hr tablet Take 50 mg by mouth daily. Take with or immediately following a meal.    . ranitidine (ZANTAC) 150 MG tablet Take 150 mg by mouth 2 (two) times daily.    . simvastatin (ZOCOR) 20 MG tablet Take 20 mg by mouth daily.    .  diclofenac (VOLTAREN) 75 MG EC tablet Take 75 mg by mouth 2 (two) times daily. Reported on 09/14/2015    . docusate sodium (COLACE) 100 MG capsule Take 100 mg by mouth 2 (two) times daily as needed for mild constipation. Reported on 09/14/2015    . Linaclotide (LINZESS) 290 MCG CAPS capsule Take 290 mcg by mouth daily. Reported on 09/14/2015    . tiZANidine (ZANAFLEX) 4 MG tablet Take 4 mg by mouth every 6 (six) hours as needed for muscle spasms. Reported on 09/14/2015     No current facility-administered medications for this visit.    REVIEW OF SYSTEMS:  [X]  denotes positive finding, [ ]  denotes negative finding Cardiac  Comments:  Chest pain or chest pressure:    Shortness of breath upon exertion:    Short of breath when lying flat:    Irregular heart rhythm:        Vascular    Pain in calf, thigh, or hip brought on by ambulation:    Pain in feet at night that wakes you up from your sleep:     Blood clot in your veins:    Leg swelling:         Pulmonary    Oxygen at home:    Productive cough:  x   Wheezing:         Neurologic    Sudden weakness in arms or legs:     Sudden  numbness in arms or legs:     Sudden onset of difficulty speaking or slurred speech:    Temporary loss of vision in one eye:     Problems with dizziness:         Gastrointestinal    Blood in stool:     Vomited blood:         Genitourinary    Burning when urinating:     Blood in urine:        Psychiatric    Major depression:         Hematologic    Bleeding problems:    Problems with blood clotting too easily:        Skin    Rashes or ulcers:        Constitutional    Fever or chills:      PHYSICAL EXAM: Filed Vitals:   09/14/15 0945  BP: 138/57  Pulse: 66  Temp: 97.7 F (36.5 C)  TempSrc: Oral  Resp: 20  Height: 5\' 7"  (1.702 m)  Weight: 173 lb 3.2 oz (78.563 kg)    GENERAL: The patient is a well-nourished female, in no acute distress. The vital signs are documented above. CARDIAC: There  is a regular rate and rhythm.  VASCULAR: 2+ femoral and 2+ dorsalis pedis pulses bilaterally. PULMONARY: There is good air exchange bilaterally without wheezing or rales. ABDOMEN: Soft and non-tender with normal pitched bowel sounds. Well-healed midline incision MUSCULOSKELETAL: There are no major deformities or cyanosis. NEUROLOGIC: No focal weakness or paresthesias are detected. SKIN: There are no ulcers or rashes noted. PSYCHIATRIC: The patient has a normal affect.  DATA:  I did review her CT scan from 09/07/2015. This shows no significant change. She does have diffuse irregularity throughout her descending thoracic aorta. The largest of these outpouchings is a 4.2 cm diameter of her aorta. The prior infrarenal repair looks quite good with no evidence of false aneurysm or other difficulty.  MEDICAL ISSUES: Descending thoracic Aneurysm with no change in one year. Recommended yearly follow-up of this. Did discuss symptoms related with the expansion of this and she knows report immediately to the emergency room should this occur. Otherwise we'll see her again in one year with repeat CT scan No Follow-up on file.   Curt Jews [facility] [place]: [phone]

## 2015-09-16 NOTE — Addendum Note (Signed)
Addended by: Dorthula Rue L on: 09/16/2015 04:57 PM   Modules accepted: Orders

## 2016-02-10 ENCOUNTER — Encounter: Payer: Self-pay | Admitting: *Deleted

## 2016-02-11 ENCOUNTER — Ambulatory Visit (INDEPENDENT_AMBULATORY_CARE_PROVIDER_SITE_OTHER): Payer: Medicare HMO | Admitting: Cardiovascular Disease

## 2016-02-11 ENCOUNTER — Encounter: Payer: Self-pay | Admitting: Cardiovascular Disease

## 2016-02-11 ENCOUNTER — Encounter: Payer: Self-pay | Admitting: *Deleted

## 2016-02-11 VITALS — BP 144/84 | HR 77 | Ht 67.0 in | Wt 175.0 lb

## 2016-02-11 DIAGNOSIS — R079 Chest pain, unspecified: Secondary | ICD-10-CM | POA: Diagnosis not present

## 2016-02-11 DIAGNOSIS — I712 Thoracic aortic aneurysm, without rupture, unspecified: Secondary | ICD-10-CM

## 2016-02-11 DIAGNOSIS — Z720 Tobacco use: Secondary | ICD-10-CM

## 2016-02-11 DIAGNOSIS — E785 Hyperlipidemia, unspecified: Secondary | ICD-10-CM

## 2016-02-11 MED ORDER — ASPIRIN EC 81 MG PO TBEC: 81.0000 mg | DELAYED_RELEASE_TABLET | Freq: Every day | ORAL | Status: DC

## 2016-02-11 NOTE — Patient Instructions (Signed)
Begin Aspirin 81mg  daily Continue all other medications.   Your physician has requested that you have a lexiscan myoview. For further information please visit [email or web address]. Please follow instruction sheet, as given. Office will contact with results via phone or letter.   Follow up in  6 weeks

## 2016-02-11 NOTE — Progress Notes (Signed)
Patient ID: [name], female   DOB: 08-13-1946, 70 y.o.   MRN: [id number]       CARDIOLOGY CONSULT NOTE  Patient ID: [name] MRN: [id number] DOB/AGE: 1945/10/24 70 y.o.  Admit date: (Not on file) Primary Physician: Glenda Chroman, MD Referring Physician: Woody Seller MD  Reason for Consultation: chest pain  HPI: The patient is a 70 year old woman with a history of repaired abdominal aortic aneurysm and a descending thoracic aortic aneurysm measuring 4.2 cm which is followed by vascular surgery.   She was recently admitted to [facility] with chest pain. She has a history of tobacco abuse and COPD as well as hyperlipidemia. Cardiac enzymes were reportedly normal. CTA of the chest was done which did not show pulmonary embolus. Chest x-ray and telemetry were also reportedly normal. Pertinent labs include BUN 14, creatinine 1.04, sodium 138, potassium 4.6, d-dimer 2.33, proBNP 80, white blood cells 8, hemoglobin 14.7. She was discharged on 01/28/16.  ECG performed on 01/27/16 which I personally interpreted demonstrated sinus rhythm with no ischemic ST segment or T-wave abnormalities, nor any arrhythmias.  She tells me the pain which brought her to the hospital was retrosternal in location and accompanied by nausea. She has had occasional exertional dyspnea for the past month. She smokes a half pack of cigarettes daily. She has been awoken by shortness of breath. She has occasional lightheadedness but denies syncope. She denies leg swelling but does have leg and feet cramping.   Allergies  Allergen Reactions  . Tramadol Itching    Current Outpatient Prescriptions  Medication Sig Dispense Refill  . albuterol (PROVENTIL HFA;VENTOLIN HFA) 108 (90 BASE) MCG/ACT inhaler Inhale 2 puffs into the lungs every 6 (six) hours as needed for wheezing or shortness of breath.    . docusate sodium (COLACE) 100 MG capsule Take 100 mg by mouth 2 (two) times daily as needed for mild constipation.  Reported on 09/14/2015    . Fluticasone-Salmeterol (ADVAIR) 100-50 MCG/DOSE AEPB Inhale 1 puff into the lungs 2 (two) times daily.    . metoprolol succinate (TOPROL-XL) 50 MG 24 hr tablet Take 25 mg by mouth daily. Take with or immediately following a meal.    . ranitidine (ZANTAC) 150 MG tablet Take 150 mg by mouth 2 (two) times daily.    . simvastatin (ZOCOR) 20 MG tablet Take 20 mg by mouth daily.    Marland Kitchen tiZANidine (ZANAFLEX) 4 MG tablet Take 4 mg by mouth every 6 (six) hours as needed for muscle spasms. Reported on 09/14/2015     No current facility-administered medications for this visit.    Past Medical History  Diagnosis Date  . Hypertension   . GERD (gastroesophageal reflux disease)   . AAA (abdominal aortic aneurysm) ([place])   . Moderate COPD (chronic obstructive pulmonary disease) ([place])   . Osteoporosis   . Hyperlipidemia   . Vertigo   . Tobacco abuse     Past Surgical History  Procedure Laterality Date  . Abdominal aortic aneurysm repair      Social History   Social History  . Marital Status: Married    Spouse Name: N/A  . Number of Children: N/A  . Years of Education: N/A   Occupational History  . Not on file.   Social History Main Topics  . Smoking status: Current Every Day Smoker -- 1.00 packs/day for 50 years    Types: Cigarettes  . Smokeless tobacco: Never Used  . Alcohol Use: Not on file  . Drug  Use: Not on file  . Sexual Activity: Not on file   Other Topics Concern  . Not on file   Social History Narrative     No family history of premature CAD in 1st degree relatives.  Prior to Admission medications   Medication Sig Start Date End Date Taking? Authorizing Provider  albuterol (PROVENTIL HFA;VENTOLIN HFA) 108 (90 BASE) MCG/ACT inhaler Inhale 2 puffs into the lungs every 6 (six) hours as needed for wheezing or shortness of breath.   Yes Historical Provider, MD  docusate sodium (COLACE) 100 MG capsule Take 100 mg by mouth 2 (two) times daily as needed  for mild constipation. Reported on 09/14/2015   Yes Historical Provider, MD  Fluticasone-Salmeterol (ADVAIR) 100-50 MCG/DOSE AEPB Inhale 1 puff into the lungs 2 (two) times daily.   Yes Historical Provider, MD  metoprolol succinate (TOPROL-XL) 50 MG 24 hr tablet Take 25 mg by mouth daily. Take with or immediately following a meal.   Yes Historical Provider, MD  ranitidine (ZANTAC) 150 MG tablet Take 150 mg by mouth 2 (two) times daily.   Yes Historical Provider, MD  simvastatin (ZOCOR) 20 MG tablet Take 20 mg by mouth daily.   Yes Historical Provider, MD  tiZANidine (ZANAFLEX) 4 MG tablet Take 4 mg by mouth every 6 (six) hours as needed for muscle spasms. Reported on 09/14/2015   Yes Historical Provider, MD     Review of systems complete and found to be negative unless listed above in HPI     Physical exam Blood pressure 144/84, pulse 77, height 5\' 7"  (1.702 m), weight 175 lb (79.379 kg), SpO2 96 %. General: NAD Neck: No JVD, no thyromegaly or thyroid nodule.  Lungs: Clear to auscultation bilaterally with normal respiratory effort. CV: Nondisplaced PMI. Regular rate and rhythm, normal S1/S2, no S3/S4, no murmur.  No peripheral edema.  No carotid bruit.  Normal pedal pulses.  Abdomen: Soft, nontender, no hepatosplenomegaly, no distention.  Skin: Intact without lesions or rashes.  Neurologic: Alert and oriented x 3.  Psych: Normal affect. Extremities: No clubbing or cyanosis.  HEENT: Normal.   ECG: Most recent ECG reviewed.  Labs:   Lab Results  Component Value Date   WBC 7.3 10/16/2010   HGB 10.5* 10/16/2010   HCT 30.8* 10/16/2010   MCV 87.7 10/16/2010   PLT 265 10/16/2010   No results for input(s): NA, K, CL, CO2, BUN, CREATININE, CALCIUM, PROT, BILITOT, ALKPHOS, ALT, AST, GLUCOSE in the last 168 hours.  Invalid input(s): LABALBU No results found for: CKTOTAL, CKMB, CKMBINDEX, TROPONINI No results found for: CHOL No results found for: HDL No results found for: LDLCALC No  results found for: TRIG No results found for: CHOLHDL No results found for: LDLDIRECT       Studies: No results found.  ASSESSMENT AND PLAN:  1. Chest pain:The pain which brought her to the hospital is concerning for ischemic heart disease given her cardiovascular risk factors. Will start aspirin 81 mg daily. I will proceed with a nuclear myocardial perfusion imaging study (Lexiscan) to evaluate for ischemic heart disease.  2. Hyperlipidemia: Continue statin therapy.  3. Tobacco abuse disorder: Smokes 0.5 ppd.  4. AAA s/p repair  5. Descending thoracic aortic aneurysm: Followed by vascular surgery.  Dispo: fu 6 weeks.   Signed: Kate Sable, M.D., F.A.C.C.  02/11/2016, 3:54 PM

## 2016-02-21 ENCOUNTER — Encounter (HOSPITAL_COMMUNITY): Payer: Self-pay

## 2016-02-21 ENCOUNTER — Encounter (HOSPITAL_COMMUNITY)
Admission: RE | Admit: 2016-02-21 | Discharge: 2016-02-21 | Disposition: A | Discharge status: Home / Self Care | Payer: Medicare HMO | Source: Ambulatory Visit | Attending: Cardiovascular Disease | Admitting: Cardiovascular Disease

## 2016-02-21 ENCOUNTER — Inpatient Hospital Stay (HOSPITAL_COMMUNITY): Admission: RE | Admit: 2016-02-21 | Payer: Medicare HMO | Source: Ambulatory Visit

## 2016-02-21 DIAGNOSIS — R079 Chest pain, unspecified: Secondary | ICD-10-CM | POA: Insufficient documentation

## 2016-02-21 LAB — NM MYOCAR MULTI W/SPECT W/WALL MOTION / EF
CHL CUP NUCLEAR SDS: 2
CHL CUP NUCLEAR SRS: 3
CHL CUP NUCLEAR SSS: 5
LHR: 0
LV sys vol: 5 mL
LVDIAVOL: 33 mL (ref 46–106)
Peak HR: 129 {beats}/min
Rest HR: 78 {beats}/min
TID: 0.95

## 2016-02-21 MED ORDER — REGADENOSON 0.4 MG/5ML IV SOLN
INTRAVENOUS | Status: AC
Start: 2016-02-21 — End: 2016-02-21
  Administered 2016-02-21: 0.4 mg via INTRAVENOUS
  Filled 2016-02-21: qty 5

## 2016-02-21 MED ORDER — TECHNETIUM TC 99M TETROFOSMIN IV KIT
30.0000 | PACK | Freq: Once | INTRAVENOUS | Status: AC | PRN
  Administered 2016-02-21: 32 via INTRAVENOUS

## 2016-02-21 MED ORDER — SODIUM CHLORIDE 0.9% FLUSH
INTRAVENOUS | Status: AC
  Administered 2016-02-21: 10 mL via INTRAVENOUS
  Filled 2016-02-21: qty 10

## 2016-02-21 MED ORDER — TECHNETIUM TC 99M TETROFOSMIN IV KIT
10.0000 | PACK | Freq: Once | INTRAVENOUS | Status: AC | PRN
  Administered 2016-02-21: 10 via INTRAVENOUS

## 2016-02-22 ENCOUNTER — Telehealth: Payer: Self-pay | Admitting: *Deleted

## 2016-02-22 NOTE — Telephone Encounter (Signed)
Notes Recorded by Laurine Blazer, LPN on [id number] at [id number] AM Patient notified and verbalized understanding. Copy to pmd. Follow up scheduled for 03/27/2016.  Notes Recorded by Herminio Commons, MD on 02/21/2016 at 3:55 PM Normal.

## 2016-03-27 ENCOUNTER — Encounter: Payer: Self-pay | Admitting: Cardiovascular Disease

## 2016-03-27 ENCOUNTER — Ambulatory Visit (INDEPENDENT_AMBULATORY_CARE_PROVIDER_SITE_OTHER): Payer: Medicare HMO | Admitting: Cardiovascular Disease

## 2016-03-27 VITALS — BP 130/72 | HR 72 | Ht 67.0 in | Wt 172.0 lb

## 2016-03-27 DIAGNOSIS — R079 Chest pain, unspecified: Secondary | ICD-10-CM

## 2016-03-27 DIAGNOSIS — E785 Hyperlipidemia, unspecified: Secondary | ICD-10-CM

## 2016-03-27 DIAGNOSIS — Z720 Tobacco use: Secondary | ICD-10-CM

## 2016-03-27 DIAGNOSIS — Z136 Encounter for screening for cardiovascular disorders: Secondary | ICD-10-CM

## 2016-03-27 DIAGNOSIS — I712 Thoracic aortic aneurysm, without rupture, unspecified: Secondary | ICD-10-CM

## 2016-03-27 DIAGNOSIS — IMO0001 Reserved for inherently not codable concepts without codable children: Secondary | ICD-10-CM

## 2016-03-27 NOTE — Progress Notes (Signed)
Patient ID: [name], female   DOB: 1946-04-18, 70 y.o.   MRN: [id number]      SUBJECTIVE: The patient returns for follow-up after undergoing cardiovascular testing performed for the evaluation of chest pain. She underwent a normal nuclear stress test on 02/21/16, LVEF 86%.  She has had no recurrence of chest pain.   Review of Systems: As per "subjective", otherwise negative.  Allergies  Allergen Reactions  . Tramadol Itching    Current Outpatient Prescriptions  Medication Sig Dispense Refill  . albuterol (PROVENTIL HFA;VENTOLIN HFA) 108 (90 BASE) MCG/ACT inhaler Inhale 2 puffs into the lungs every 6 (six) hours as needed for wheezing or shortness of breath.    Marland Kitchen aspirin EC 81 MG tablet Take 1 tablet (81 mg total) by mouth daily.    Marland Kitchen docusate sodium (COLACE) 100 MG capsule Take 100 mg by mouth 2 (two) times daily as needed for mild constipation. Reported on 09/14/2015    . Fluticasone-Salmeterol (ADVAIR) 100-50 MCG/DOSE AEPB Inhale 1 puff into the lungs 2 (two) times daily.    . metoprolol succinate (TOPROL-XL) 50 MG 24 hr tablet Take 25 mg by mouth daily. Take with or immediately following a meal.    . ranitidine (ZANTAC) 150 MG tablet Take 150 mg by mouth 2 (two) times daily.    . simvastatin (ZOCOR) 20 MG tablet Take 20 mg by mouth daily.    Marland Kitchen tiZANidine (ZANAFLEX) 4 MG tablet Take 4 mg by mouth every 6 (six) hours as needed for muscle spasms. Reported on 09/14/2015     No current facility-administered medications for this visit.    Past Medical History  Diagnosis Date  . Hypertension   . GERD (gastroesophageal reflux disease)   . AAA (abdominal aortic aneurysm) ([place])   . Moderate COPD (chronic obstructive pulmonary disease) ([place])   . Osteoporosis   . Hyperlipidemia   . Vertigo   . Tobacco abuse     Past Surgical History  Procedure Laterality Date  . Abdominal aortic aneurysm repair      Social History   Social History  . Marital Status: Married    Spouse Name:  N/A  . Number of Children: N/A  . Years of Education: N/A   Occupational History  . Not on file.   Social History Main Topics  . Smoking status: Current Every Day Smoker -- 1.00 packs/day for 50 years    Types: Cigarettes  . Smokeless tobacco: Never Used  . Alcohol Use: Not on file  . Drug Use: Not on file  . Sexual Activity: Not on file   Other Topics Concern  . Not on file   Social History Narrative     Filed Vitals:   03/27/16 1453  BP: 130/72  Pulse: 72  Height: 5\' 7"  (1.702 m)  Weight: 172 lb (78.019 kg)  SpO2: 96%    PHYSICAL EXAM General: NAD HEENT: Normal. Neck: No JVD, no thyromegaly. Lungs: Clear to auscultation bilaterally with normal respiratory effort. CV: Nondisplaced PMI.  Regular rate and rhythm, normal S1/S2, no S3/S4, no murmur. No pretibial or periankle edema.  No carotid bruit.   Abdomen: Soft, nontender, no distention.  Neurologic: Alert and oriented.  Psych: Normal affect. Skin: Normal. Musculoskeletal: No gross deformities.    ECG: Most recent ECG reviewed.      ASSESSMENT AND PLAN: 1. Chest pain: No recurrence. Normal nuclear stress test.  2. Hyperlipidemia: Continue statin therapy.  3. Tobacco abuse disorder: Smokes 0.5 ppd.  4. AAA s/p  repair  5. Descending thoracic aortic aneurysm: Followed by vascular surgery.  Dispo: fu prn.  Kate Sable, M.D., F.A.C.C.

## 2016-03-27 NOTE — Patient Instructions (Signed)
Medication Instructions:  Continue all current medications.  Labwork: NONE  Testing/Procedures: NONE  Follow-Up: AS NEEDED   Any Other Special Instructions Will Be Listed Below (If Applicable).  If you need a refill on your cardiac medications before your next appointment, please call your pharmacy.

## 2016-09-13 DIAGNOSIS — Z01812 Encounter for preprocedural laboratory examination: Secondary | ICD-10-CM | POA: Diagnosis not present

## 2016-09-13 DIAGNOSIS — I712 Thoracic aortic aneurysm, without rupture: Secondary | ICD-10-CM | POA: Diagnosis not present

## 2016-09-13 DIAGNOSIS — I714 Abdominal aortic aneurysm, without rupture: Secondary | ICD-10-CM | POA: Diagnosis not present

## 2016-09-19 ENCOUNTER — Ambulatory Visit
Admission: RE | Admit: 2016-09-19 | Discharge: 2016-09-19 | Disposition: A | Discharge status: Home / Self Care | Payer: Medicare HMO | Source: Ambulatory Visit | Attending: Vascular Surgery | Admitting: Vascular Surgery

## 2016-09-19 DIAGNOSIS — M47814 Spondylosis without myelopathy or radiculopathy, thoracic region: Secondary | ICD-10-CM | POA: Diagnosis not present

## 2016-09-19 DIAGNOSIS — M546 Pain in thoracic spine: Secondary | ICD-10-CM | POA: Diagnosis not present

## 2016-09-19 DIAGNOSIS — I712 Thoracic aortic aneurysm, without rupture, unspecified: Secondary | ICD-10-CM

## 2016-09-19 DIAGNOSIS — R69 Illness, unspecified: Secondary | ICD-10-CM | POA: Diagnosis not present

## 2016-09-19 DIAGNOSIS — M419 Scoliosis, unspecified: Secondary | ICD-10-CM | POA: Diagnosis not present

## 2016-09-19 DIAGNOSIS — Z299 Encounter for prophylactic measures, unspecified: Secondary | ICD-10-CM | POA: Diagnosis not present

## 2016-09-19 DIAGNOSIS — J449 Chronic obstructive pulmonary disease, unspecified: Secondary | ICD-10-CM | POA: Diagnosis not present

## 2016-09-19 DIAGNOSIS — I10 Essential (primary) hypertension: Secondary | ICD-10-CM | POA: Diagnosis not present

## 2016-09-19 DIAGNOSIS — I714 Abdominal aortic aneurysm, without rupture, unspecified: Secondary | ICD-10-CM

## 2016-09-19 DIAGNOSIS — I700 Atherosclerosis of aorta: Secondary | ICD-10-CM | POA: Diagnosis not present

## 2016-09-19 MED ORDER — IOPAMIDOL (ISOVUE-370) INJECTION 76%
50.0000 mL | Freq: Once | INTRAVENOUS | Status: AC | PRN
  Administered 2016-09-19: 50 mL via INTRAVENOUS

## 2016-09-25 DIAGNOSIS — Z713 Dietary counseling and surveillance: Secondary | ICD-10-CM | POA: Diagnosis not present

## 2016-09-25 DIAGNOSIS — Z299 Encounter for prophylactic measures, unspecified: Secondary | ICD-10-CM | POA: Diagnosis not present

## 2016-09-25 DIAGNOSIS — J449 Chronic obstructive pulmonary disease, unspecified: Secondary | ICD-10-CM | POA: Diagnosis not present

## 2016-09-25 DIAGNOSIS — I10 Essential (primary) hypertension: Secondary | ICD-10-CM | POA: Diagnosis not present

## 2016-09-25 DIAGNOSIS — Z6824 Body mass index (BMI) 24.0-24.9, adult: Secondary | ICD-10-CM | POA: Diagnosis not present

## 2016-09-25 DIAGNOSIS — R69 Illness, unspecified: Secondary | ICD-10-CM | POA: Diagnosis not present

## 2016-09-26 ENCOUNTER — Ambulatory Visit: Payer: Medicare HMO | Admitting: Vascular Surgery

## 2016-09-28 ENCOUNTER — Encounter: Payer: Self-pay | Admitting: Family

## 2016-10-02 DIAGNOSIS — Z79899 Other long term (current) drug therapy: Secondary | ICD-10-CM | POA: Diagnosis not present

## 2016-10-02 DIAGNOSIS — R252 Cramp and spasm: Secondary | ICD-10-CM | POA: Diagnosis not present

## 2016-10-02 DIAGNOSIS — M549 Dorsalgia, unspecified: Secondary | ICD-10-CM | POA: Diagnosis not present

## 2016-10-02 DIAGNOSIS — I10 Essential (primary) hypertension: Secondary | ICD-10-CM | POA: Diagnosis not present

## 2016-10-03 ENCOUNTER — Ambulatory Visit (INDEPENDENT_AMBULATORY_CARE_PROVIDER_SITE_OTHER): Payer: Medicare HMO | Admitting: Vascular Surgery

## 2016-10-03 ENCOUNTER — Encounter: Payer: Self-pay | Admitting: Vascular Surgery

## 2016-10-03 VITALS — BP 140/79 | HR 70 | Temp 97.7°F | Resp 18 | Ht 67.0 in | Wt 158.0 lb

## 2016-10-03 DIAGNOSIS — I712 Thoracic aortic aneurysm, without rupture, unspecified: Secondary | ICD-10-CM

## 2016-10-03 NOTE — Progress Notes (Signed)
Vascular and Vein Specialist of [facility]  Patient name: [name] MRN: [id number] DOB: June 05, 1946 Sex: female  REASON FOR VISIT: Follow-up thoracic aneurysm  HPI: [name] is a 71 y.o. female here today for follow-up. She had an incidental finding of ectasia of her thoracic aorta*1 years ago with a formal CT abdomen pelvis. This showed some ectasia at her suprarenal aorta with a one area penetrating ulcer with a maximal diameter of 4.7 including penetrating ulcer. She is here today for follow-up and discussion of her recent CT scan. Her main continue immune difficulty is back pain. She reports severe chronic back pain unrelated activity. She recently had a trial of prednisone, muscle locked her in hydrocodone which made this somewhat better but she persisted in having a severe pain. Also reports pain in her left knee. Tunnel pain.  Past Medical History:  Diagnosis Date  . AAA (abdominal aortic aneurysm) ([place])   . GERD (gastroesophageal reflux disease)   . Hyperlipidemia   . Hypertension   . Moderate COPD (chronic obstructive pulmonary disease) ([place])   . Osteoporosis   . Tobacco abuse   . Vertigo     History reviewed. No pertinent family history.  SOCIAL HISTORY: Social History  Substance Use Topics  . Smoking status: Current Every Day Smoker    Packs/day: 1.00    Years: 50.00    Types: Cigarettes  . Smokeless tobacco: Never Used  . Alcohol use Not on file    Allergies  Allergen Reactions  . Tramadol Itching    Current Outpatient Prescriptions  Medication Sig Dispense Refill  . albuterol (PROVENTIL HFA;VENTOLIN HFA) 108 (90 BASE) MCG/ACT inhaler Inhale 2 puffs into the lungs every 6 (six) hours as needed for wheezing or shortness of breath.    Marland Kitchen aspirin EC 81 MG tablet Take 1 tablet (81 mg total) by mouth daily.    . baclofen (LIORESAL) 10 MG tablet Take 10 mg by mouth 3 (three) times daily.    . Fluticasone-Salmeterol  (ADVAIR) 100-50 MCG/DOSE AEPB Inhale 1 puff into the lungs as needed.     . metoprolol succinate (TOPROL-XL) 50 MG 24 hr tablet Take 50 mg by mouth daily. Take with or immediately following a meal.     . ranitidine (ZANTAC) 150 MG tablet Take 150 mg by mouth 2 (two) times daily.    . simvastatin (ZOCOR) 20 MG tablet Take 20 mg by mouth daily.    . valsartan (DIOVAN) 160 MG tablet Take 160 mg by mouth daily.    Marland Kitchen docusate sodium (COLACE) 100 MG capsule Take 100 mg by mouth 2 (two) times daily as needed for mild constipation. Reported on 09/14/2015    . tiZANidine (ZANAFLEX) 4 MG tablet Take 4 mg by mouth every 6 (six) hours as needed for muscle spasms. Reported on 09/14/2015     No current facility-administered medications for this visit.     REVIEW OF SYSTEMS:  [X]  denotes positive finding, [ ]  denotes negative finding Cardiac  Comments:  Chest pain or chest pressure:    Shortness of breath upon exertion:    Short of breath when lying flat:    Irregular heart rhythm:        Vascular    Pain in calf, thigh, or hip brought on by ambulation:    Pain in feet at night that wakes you up from your sleep:     Blood clot in your veins:    Leg swelling:  PHYSICAL EXAM: Vitals:   10/03/16 0918  BP: 140/79  Pulse: 70  Resp: 18  Temp: 97.7 F (36.5 C)  TempSrc: Oral  SpO2: 96%  Weight: 158 lb (71.7 kg)  Height: 5\' 7"  (1.702 m)    GENERAL: The patient is a well-nourished female, in no acute distress. The vital signs are documented above. CARDIOVASCULAR: 2+ radial and 2+ dorsalis pedis pulses bilaterally. Carotid arteries without bruits. Heart regular rate and rhythm PULMONARY: There is good air exchange  MUSCULOSKELETAL: There are no major deformities or cyanosis. NEUROLOGIC: No focal weakness or paresthesias are detected. SKIN: There are no ulcers or rashes noted. PSYCHIATRIC: The patient has a normal affect.  DATA:  CT abdomen and pelvis was reviewed. This showed no change  in her thoracic aorta with a penetrating ulcer at the same level maximal diameter predicted at 4.8 cm. She does have severe degenerative disc disease on her lumbar spine on the CT scan.  MEDICAL ISSUES: No evidence of a change in her thoracic ectasia. I feel comfortable dropping back to a CT scan follow-up chest abdomen pelvis in 2 years. Her infrarenal aortic repair appears stable with no complications  Regarding her back pain, she does have severe degenerative disc disease and has ongoing evaluation for this.  Regarding her left knee pain. She does appear to have a fullness behind her left knee which is nonpulsatile. I'm not concerned regarding possible aneurysm. This may be a Baker's cyst. She will speak with Dr.Vyas about the possible further evaluation of this. We will see her again in 2 years    Rosetta Posner, MD [facility] Vascular and Vein Specialists of [facility] Tel [phone] Pager [phone]

## 2016-10-09 DIAGNOSIS — E7800 Pure hypercholesterolemia, unspecified: Secondary | ICD-10-CM | POA: Diagnosis not present

## 2016-10-09 DIAGNOSIS — J449 Chronic obstructive pulmonary disease, unspecified: Secondary | ICD-10-CM | POA: Diagnosis not present

## 2016-10-09 DIAGNOSIS — I10 Essential (primary) hypertension: Secondary | ICD-10-CM | POA: Diagnosis not present

## 2016-10-24 DIAGNOSIS — Z713 Dietary counseling and surveillance: Secondary | ICD-10-CM | POA: Diagnosis not present

## 2016-10-24 DIAGNOSIS — Z299 Encounter for prophylactic measures, unspecified: Secondary | ICD-10-CM | POA: Diagnosis not present

## 2016-10-24 DIAGNOSIS — Z6823 Body mass index (BMI) 23.0-23.9, adult: Secondary | ICD-10-CM | POA: Diagnosis not present

## 2016-10-24 DIAGNOSIS — I10 Essential (primary) hypertension: Secondary | ICD-10-CM | POA: Diagnosis not present

## 2016-10-30 DIAGNOSIS — I712 Thoracic aortic aneurysm, without rupture: Secondary | ICD-10-CM | POA: Diagnosis not present

## 2016-10-30 DIAGNOSIS — J449 Chronic obstructive pulmonary disease, unspecified: Secondary | ICD-10-CM | POA: Diagnosis not present

## 2016-10-30 DIAGNOSIS — K219 Gastro-esophageal reflux disease without esophagitis: Secondary | ICD-10-CM | POA: Diagnosis not present

## 2016-10-30 DIAGNOSIS — M545 Low back pain: Secondary | ICD-10-CM | POA: Diagnosis not present

## 2016-10-30 DIAGNOSIS — Z0000 Encounter for general adult medical examination without abnormal findings: Secondary | ICD-10-CM | POA: Diagnosis not present

## 2016-10-30 DIAGNOSIS — Z6823 Body mass index (BMI) 23.0-23.9, adult: Secondary | ICD-10-CM | POA: Diagnosis not present

## 2016-10-30 DIAGNOSIS — I10 Essential (primary) hypertension: Secondary | ICD-10-CM | POA: Diagnosis not present

## 2016-10-30 DIAGNOSIS — R69 Illness, unspecified: Secondary | ICD-10-CM | POA: Diagnosis not present

## 2016-10-30 DIAGNOSIS — J309 Allergic rhinitis, unspecified: Secondary | ICD-10-CM | POA: Diagnosis not present

## 2016-10-30 DIAGNOSIS — E785 Hyperlipidemia, unspecified: Secondary | ICD-10-CM | POA: Diagnosis not present

## 2016-12-05 DIAGNOSIS — I10 Essential (primary) hypertension: Secondary | ICD-10-CM | POA: Diagnosis not present

## 2016-12-05 DIAGNOSIS — Z299 Encounter for prophylactic measures, unspecified: Secondary | ICD-10-CM | POA: Diagnosis not present

## 2016-12-05 DIAGNOSIS — K5790 Diverticulosis of intestine, part unspecified, without perforation or abscess without bleeding: Secondary | ICD-10-CM | POA: Diagnosis not present

## 2016-12-05 DIAGNOSIS — J449 Chronic obstructive pulmonary disease, unspecified: Secondary | ICD-10-CM | POA: Diagnosis not present

## 2016-12-05 DIAGNOSIS — Z6823 Body mass index (BMI) 23.0-23.9, adult: Secondary | ICD-10-CM | POA: Diagnosis not present

## 2016-12-05 DIAGNOSIS — M1710 Unilateral primary osteoarthritis, unspecified knee: Secondary | ICD-10-CM | POA: Diagnosis not present

## 2016-12-18 DIAGNOSIS — J449 Chronic obstructive pulmonary disease, unspecified: Secondary | ICD-10-CM | POA: Diagnosis not present

## 2016-12-18 DIAGNOSIS — E7800 Pure hypercholesterolemia, unspecified: Secondary | ICD-10-CM | POA: Diagnosis not present

## 2016-12-18 DIAGNOSIS — I10 Essential (primary) hypertension: Secondary | ICD-10-CM | POA: Diagnosis not present

## 2016-12-19 DIAGNOSIS — H04123 Dry eye syndrome of bilateral lacrimal glands: Secondary | ICD-10-CM | POA: Diagnosis not present

## 2016-12-19 DIAGNOSIS — H16223 Keratoconjunctivitis sicca, not specified as Sjogren's, bilateral: Secondary | ICD-10-CM | POA: Diagnosis not present

## 2016-12-19 DIAGNOSIS — H10523 Angular blepharoconjunctivitis, bilateral: Secondary | ICD-10-CM | POA: Diagnosis not present

## 2016-12-29 DIAGNOSIS — I714 Abdominal aortic aneurysm, without rupture: Secondary | ICD-10-CM | POA: Diagnosis not present

## 2016-12-29 DIAGNOSIS — I10 Essential (primary) hypertension: Secondary | ICD-10-CM | POA: Diagnosis not present

## 2016-12-29 DIAGNOSIS — Z299 Encounter for prophylactic measures, unspecified: Secondary | ICD-10-CM | POA: Diagnosis not present

## 2016-12-29 DIAGNOSIS — Z6823 Body mass index (BMI) 23.0-23.9, adult: Secondary | ICD-10-CM | POA: Diagnosis not present

## 2016-12-29 DIAGNOSIS — J449 Chronic obstructive pulmonary disease, unspecified: Secondary | ICD-10-CM | POA: Diagnosis not present

## 2017-01-02 DIAGNOSIS — H524 Presbyopia: Secondary | ICD-10-CM | POA: Diagnosis not present

## 2017-01-02 DIAGNOSIS — Z961 Presence of intraocular lens: Secondary | ICD-10-CM | POA: Diagnosis not present

## 2017-01-02 DIAGNOSIS — H04123 Dry eye syndrome of bilateral lacrimal glands: Secondary | ICD-10-CM | POA: Diagnosis not present

## 2017-01-02 DIAGNOSIS — H35033 Hypertensive retinopathy, bilateral: Secondary | ICD-10-CM | POA: Diagnosis not present

## 2017-01-02 DIAGNOSIS — Z9849 Cataract extraction status, unspecified eye: Secondary | ICD-10-CM | POA: Diagnosis not present

## 2017-01-02 DIAGNOSIS — H16223 Keratoconjunctivitis sicca, not specified as Sjogren's, bilateral: Secondary | ICD-10-CM | POA: Diagnosis not present

## 2017-01-02 DIAGNOSIS — I10 Essential (primary) hypertension: Secondary | ICD-10-CM | POA: Diagnosis not present

## 2017-01-15 DIAGNOSIS — H43822 Vitreomacular adhesion, left eye: Secondary | ICD-10-CM | POA: Diagnosis not present

## 2017-01-15 DIAGNOSIS — H35033 Hypertensive retinopathy, bilateral: Secondary | ICD-10-CM | POA: Diagnosis not present

## 2017-01-15 DIAGNOSIS — H43813 Vitreous degeneration, bilateral: Secondary | ICD-10-CM | POA: Diagnosis not present

## 2017-01-15 DIAGNOSIS — H34212 Partial retinal artery occlusion, left eye: Secondary | ICD-10-CM | POA: Diagnosis not present

## 2017-01-16 DIAGNOSIS — K219 Gastro-esophageal reflux disease without esophagitis: Secondary | ICD-10-CM | POA: Diagnosis not present

## 2017-01-16 DIAGNOSIS — I714 Abdominal aortic aneurysm, without rupture: Secondary | ICD-10-CM | POA: Diagnosis not present

## 2017-01-16 DIAGNOSIS — Z299 Encounter for prophylactic measures, unspecified: Secondary | ICD-10-CM | POA: Diagnosis not present

## 2017-01-16 DIAGNOSIS — R69 Illness, unspecified: Secondary | ICD-10-CM | POA: Diagnosis not present

## 2017-01-16 DIAGNOSIS — I10 Essential (primary) hypertension: Secondary | ICD-10-CM | POA: Diagnosis not present

## 2017-01-16 DIAGNOSIS — Z6823 Body mass index (BMI) 23.0-23.9, adult: Secondary | ICD-10-CM | POA: Diagnosis not present

## 2017-01-16 DIAGNOSIS — M810 Age-related osteoporosis without current pathological fracture: Secondary | ICD-10-CM | POA: Diagnosis not present

## 2017-01-16 DIAGNOSIS — M1710 Unilateral primary osteoarthritis, unspecified knee: Secondary | ICD-10-CM | POA: Diagnosis not present

## 2017-01-16 DIAGNOSIS — J449 Chronic obstructive pulmonary disease, unspecified: Secondary | ICD-10-CM | POA: Diagnosis not present

## 2017-01-16 DIAGNOSIS — E7800 Pure hypercholesterolemia, unspecified: Secondary | ICD-10-CM | POA: Diagnosis not present

## 2017-01-29 DIAGNOSIS — H53139 Sudden visual loss, unspecified eye: Secondary | ICD-10-CM | POA: Diagnosis not present

## 2017-01-29 DIAGNOSIS — I6523 Occlusion and stenosis of bilateral carotid arteries: Secondary | ICD-10-CM | POA: Diagnosis not present

## 2017-01-29 DIAGNOSIS — I351 Nonrheumatic aortic (valve) insufficiency: Secondary | ICD-10-CM | POA: Diagnosis not present

## 2017-01-29 DIAGNOSIS — G459 Transient cerebral ischemic attack, unspecified: Secondary | ICD-10-CM | POA: Diagnosis not present

## 2017-02-08 DIAGNOSIS — E7800 Pure hypercholesterolemia, unspecified: Secondary | ICD-10-CM | POA: Diagnosis not present

## 2017-02-08 DIAGNOSIS — J449 Chronic obstructive pulmonary disease, unspecified: Secondary | ICD-10-CM | POA: Diagnosis not present

## 2017-02-08 DIAGNOSIS — I10 Essential (primary) hypertension: Secondary | ICD-10-CM | POA: Diagnosis not present

## 2017-02-12 DIAGNOSIS — H35033 Hypertensive retinopathy, bilateral: Secondary | ICD-10-CM | POA: Diagnosis not present

## 2017-02-12 DIAGNOSIS — H43813 Vitreous degeneration, bilateral: Secondary | ICD-10-CM | POA: Diagnosis not present

## 2017-02-12 DIAGNOSIS — H34212 Partial retinal artery occlusion, left eye: Secondary | ICD-10-CM | POA: Diagnosis not present

## 2017-03-01 DIAGNOSIS — Z1211 Encounter for screening for malignant neoplasm of colon: Secondary | ICD-10-CM | POA: Diagnosis not present

## 2017-03-01 DIAGNOSIS — Z1389 Encounter for screening for other disorder: Secondary | ICD-10-CM | POA: Diagnosis not present

## 2017-03-01 DIAGNOSIS — Z7189 Other specified counseling: Secondary | ICD-10-CM | POA: Diagnosis not present

## 2017-03-01 DIAGNOSIS — I10 Essential (primary) hypertension: Secondary | ICD-10-CM | POA: Diagnosis not present

## 2017-03-01 DIAGNOSIS — Z299 Encounter for prophylactic measures, unspecified: Secondary | ICD-10-CM | POA: Diagnosis not present

## 2017-03-01 DIAGNOSIS — E7800 Pure hypercholesterolemia, unspecified: Secondary | ICD-10-CM | POA: Diagnosis not present

## 2017-03-01 DIAGNOSIS — I700 Atherosclerosis of aorta: Secondary | ICD-10-CM | POA: Diagnosis not present

## 2017-03-01 DIAGNOSIS — Z0000 Encounter for general adult medical examination without abnormal findings: Secondary | ICD-10-CM | POA: Diagnosis not present

## 2017-03-01 DIAGNOSIS — E663 Overweight: Secondary | ICD-10-CM | POA: Diagnosis not present

## 2017-03-01 DIAGNOSIS — M810 Age-related osteoporosis without current pathological fracture: Secondary | ICD-10-CM | POA: Diagnosis not present

## 2017-03-05 DIAGNOSIS — E7800 Pure hypercholesterolemia, unspecified: Secondary | ICD-10-CM | POA: Diagnosis not present

## 2017-03-05 DIAGNOSIS — Z0000 Encounter for general adult medical examination without abnormal findings: Secondary | ICD-10-CM | POA: Diagnosis not present

## 2017-03-05 DIAGNOSIS — Z79899 Other long term (current) drug therapy: Secondary | ICD-10-CM | POA: Diagnosis not present

## 2017-04-18 DIAGNOSIS — I714 Abdominal aortic aneurysm, without rupture: Secondary | ICD-10-CM | POA: Diagnosis not present

## 2017-04-18 DIAGNOSIS — Z6824 Body mass index (BMI) 24.0-24.9, adult: Secondary | ICD-10-CM | POA: Diagnosis not present

## 2017-04-18 DIAGNOSIS — R5383 Other fatigue: Secondary | ICD-10-CM | POA: Diagnosis not present

## 2017-04-18 DIAGNOSIS — I10 Essential (primary) hypertension: Secondary | ICD-10-CM | POA: Diagnosis not present

## 2017-04-18 DIAGNOSIS — J449 Chronic obstructive pulmonary disease, unspecified: Secondary | ICD-10-CM | POA: Diagnosis not present

## 2017-04-18 DIAGNOSIS — R69 Illness, unspecified: Secondary | ICD-10-CM | POA: Diagnosis not present

## 2017-04-18 DIAGNOSIS — N183 Chronic kidney disease, stage 3 (moderate): Secondary | ICD-10-CM | POA: Diagnosis not present

## 2017-04-18 DIAGNOSIS — M1710 Unilateral primary osteoarthritis, unspecified knee: Secondary | ICD-10-CM | POA: Diagnosis not present

## 2017-04-18 DIAGNOSIS — M810 Age-related osteoporosis without current pathological fracture: Secondary | ICD-10-CM | POA: Diagnosis not present

## 2017-04-18 DIAGNOSIS — Z299 Encounter for prophylactic measures, unspecified: Secondary | ICD-10-CM | POA: Diagnosis not present

## 2017-05-16 DIAGNOSIS — Z299 Encounter for prophylactic measures, unspecified: Secondary | ICD-10-CM | POA: Diagnosis not present

## 2017-05-16 DIAGNOSIS — I10 Essential (primary) hypertension: Secondary | ICD-10-CM | POA: Diagnosis not present

## 2017-05-16 DIAGNOSIS — N183 Chronic kidney disease, stage 3 (moderate): Secondary | ICD-10-CM | POA: Diagnosis not present

## 2017-05-16 DIAGNOSIS — R69 Illness, unspecified: Secondary | ICD-10-CM | POA: Diagnosis not present

## 2017-05-16 DIAGNOSIS — Z6824 Body mass index (BMI) 24.0-24.9, adult: Secondary | ICD-10-CM | POA: Diagnosis not present

## 2017-05-16 DIAGNOSIS — J449 Chronic obstructive pulmonary disease, unspecified: Secondary | ICD-10-CM | POA: Diagnosis not present

## 2017-05-16 DIAGNOSIS — M545 Low back pain: Secondary | ICD-10-CM | POA: Diagnosis not present

## 2017-05-16 DIAGNOSIS — E7800 Pure hypercholesterolemia, unspecified: Secondary | ICD-10-CM | POA: Diagnosis not present

## 2017-06-14 DIAGNOSIS — R69 Illness, unspecified: Secondary | ICD-10-CM | POA: Diagnosis not present

## 2017-06-22 DIAGNOSIS — Z6824 Body mass index (BMI) 24.0-24.9, adult: Secondary | ICD-10-CM | POA: Diagnosis not present

## 2017-06-22 DIAGNOSIS — M1710 Unilateral primary osteoarthritis, unspecified knee: Secondary | ICD-10-CM | POA: Diagnosis not present

## 2017-06-22 DIAGNOSIS — J309 Allergic rhinitis, unspecified: Secondary | ICD-10-CM | POA: Diagnosis not present

## 2017-06-22 DIAGNOSIS — Z299 Encounter for prophylactic measures, unspecified: Secondary | ICD-10-CM | POA: Diagnosis not present

## 2017-06-22 DIAGNOSIS — L309 Dermatitis, unspecified: Secondary | ICD-10-CM | POA: Diagnosis not present

## 2017-06-22 DIAGNOSIS — I10 Essential (primary) hypertension: Secondary | ICD-10-CM | POA: Diagnosis not present

## 2017-08-10 DIAGNOSIS — J449 Chronic obstructive pulmonary disease, unspecified: Secondary | ICD-10-CM | POA: Diagnosis not present

## 2017-08-10 DIAGNOSIS — I10 Essential (primary) hypertension: Secondary | ICD-10-CM | POA: Diagnosis not present

## 2017-08-10 DIAGNOSIS — E7800 Pure hypercholesterolemia, unspecified: Secondary | ICD-10-CM | POA: Diagnosis not present

## 2017-08-13 DIAGNOSIS — H43813 Vitreous degeneration, bilateral: Secondary | ICD-10-CM | POA: Diagnosis not present

## 2017-08-13 DIAGNOSIS — H35033 Hypertensive retinopathy, bilateral: Secondary | ICD-10-CM | POA: Diagnosis not present

## 2017-08-13 DIAGNOSIS — H34212 Partial retinal artery occlusion, left eye: Secondary | ICD-10-CM | POA: Diagnosis not present

## 2017-08-15 DIAGNOSIS — R0789 Other chest pain: Secondary | ICD-10-CM | POA: Diagnosis not present

## 2017-08-15 DIAGNOSIS — N183 Chronic kidney disease, stage 3 (moderate): Secondary | ICD-10-CM | POA: Diagnosis not present

## 2017-08-15 DIAGNOSIS — I714 Abdominal aortic aneurysm, without rupture: Secondary | ICD-10-CM | POA: Diagnosis not present

## 2017-08-15 DIAGNOSIS — Z6825 Body mass index (BMI) 25.0-25.9, adult: Secondary | ICD-10-CM | POA: Diagnosis not present

## 2017-08-15 DIAGNOSIS — M545 Low back pain: Secondary | ICD-10-CM | POA: Diagnosis not present

## 2017-08-15 DIAGNOSIS — Z299 Encounter for prophylactic measures, unspecified: Secondary | ICD-10-CM | POA: Diagnosis not present

## 2017-08-15 DIAGNOSIS — K5790 Diverticulosis of intestine, part unspecified, without perforation or abscess without bleeding: Secondary | ICD-10-CM | POA: Diagnosis not present

## 2017-08-15 DIAGNOSIS — I10 Essential (primary) hypertension: Secondary | ICD-10-CM | POA: Diagnosis not present

## 2017-08-15 DIAGNOSIS — R69 Illness, unspecified: Secondary | ICD-10-CM | POA: Diagnosis not present

## 2017-11-06 DIAGNOSIS — I10 Essential (primary) hypertension: Secondary | ICD-10-CM | POA: Diagnosis not present

## 2017-11-06 DIAGNOSIS — J449 Chronic obstructive pulmonary disease, unspecified: Secondary | ICD-10-CM | POA: Diagnosis not present

## 2017-11-06 DIAGNOSIS — E7800 Pure hypercholesterolemia, unspecified: Secondary | ICD-10-CM | POA: Diagnosis not present

## 2017-11-14 DIAGNOSIS — M8588 Other specified disorders of bone density and structure, other site: Secondary | ICD-10-CM | POA: Diagnosis not present

## 2017-11-14 DIAGNOSIS — M546 Pain in thoracic spine: Secondary | ICD-10-CM | POA: Diagnosis not present

## 2017-11-14 DIAGNOSIS — S3992XA Unspecified injury of lower back, initial encounter: Secondary | ICD-10-CM | POA: Diagnosis not present

## 2017-11-14 DIAGNOSIS — I10 Essential (primary) hypertension: Secondary | ICD-10-CM | POA: Diagnosis not present

## 2017-11-14 DIAGNOSIS — Z299 Encounter for prophylactic measures, unspecified: Secondary | ICD-10-CM | POA: Diagnosis not present

## 2017-11-14 DIAGNOSIS — J449 Chronic obstructive pulmonary disease, unspecified: Secondary | ICD-10-CM | POA: Diagnosis not present

## 2017-11-14 DIAGNOSIS — M549 Dorsalgia, unspecified: Secondary | ICD-10-CM | POA: Diagnosis not present

## 2017-11-14 DIAGNOSIS — S299XXA Unspecified injury of thorax, initial encounter: Secondary | ICD-10-CM | POA: Diagnosis not present

## 2017-11-14 DIAGNOSIS — R69 Illness, unspecified: Secondary | ICD-10-CM | POA: Diagnosis not present

## 2017-11-14 DIAGNOSIS — N183 Chronic kidney disease, stage 3 (moderate): Secondary | ICD-10-CM | POA: Diagnosis not present

## 2017-11-14 DIAGNOSIS — Z6824 Body mass index (BMI) 24.0-24.9, adult: Secondary | ICD-10-CM | POA: Diagnosis not present

## 2018-01-14 DIAGNOSIS — J449 Chronic obstructive pulmonary disease, unspecified: Secondary | ICD-10-CM | POA: Diagnosis not present

## 2018-01-14 DIAGNOSIS — K5909 Other constipation: Secondary | ICD-10-CM | POA: Diagnosis not present

## 2018-01-14 DIAGNOSIS — Z6824 Body mass index (BMI) 24.0-24.9, adult: Secondary | ICD-10-CM | POA: Diagnosis not present

## 2018-01-14 DIAGNOSIS — Z299 Encounter for prophylactic measures, unspecified: Secondary | ICD-10-CM | POA: Diagnosis not present

## 2018-01-14 DIAGNOSIS — I714 Abdominal aortic aneurysm, without rupture: Secondary | ICD-10-CM | POA: Diagnosis not present

## 2018-02-08 DIAGNOSIS — I10 Essential (primary) hypertension: Secondary | ICD-10-CM | POA: Diagnosis not present

## 2018-02-08 DIAGNOSIS — E7800 Pure hypercholesterolemia, unspecified: Secondary | ICD-10-CM | POA: Diagnosis not present

## 2018-02-08 DIAGNOSIS — J449 Chronic obstructive pulmonary disease, unspecified: Secondary | ICD-10-CM | POA: Diagnosis not present

## 2018-03-07 DIAGNOSIS — Z0000 Encounter for general adult medical examination without abnormal findings: Secondary | ICD-10-CM | POA: Diagnosis not present

## 2018-03-07 DIAGNOSIS — Z1339 Encounter for screening examination for other mental health and behavioral disorders: Secondary | ICD-10-CM | POA: Diagnosis not present

## 2018-03-07 DIAGNOSIS — Z1211 Encounter for screening for malignant neoplasm of colon: Secondary | ICD-10-CM | POA: Diagnosis not present

## 2018-03-07 DIAGNOSIS — Z6824 Body mass index (BMI) 24.0-24.9, adult: Secondary | ICD-10-CM | POA: Diagnosis not present

## 2018-03-07 DIAGNOSIS — Z7189 Other specified counseling: Secondary | ICD-10-CM | POA: Diagnosis not present

## 2018-03-07 DIAGNOSIS — Z299 Encounter for prophylactic measures, unspecified: Secondary | ICD-10-CM | POA: Diagnosis not present

## 2018-03-07 DIAGNOSIS — Z1331 Encounter for screening for depression: Secondary | ICD-10-CM | POA: Diagnosis not present

## 2018-03-08 DIAGNOSIS — E7800 Pure hypercholesterolemia, unspecified: Secondary | ICD-10-CM | POA: Diagnosis not present

## 2018-03-08 DIAGNOSIS — Z79899 Other long term (current) drug therapy: Secondary | ICD-10-CM | POA: Diagnosis not present

## 2018-03-08 DIAGNOSIS — I10 Essential (primary) hypertension: Secondary | ICD-10-CM | POA: Diagnosis not present

## 2018-03-08 DIAGNOSIS — J449 Chronic obstructive pulmonary disease, unspecified: Secondary | ICD-10-CM | POA: Diagnosis not present

## 2018-03-08 DIAGNOSIS — R5383 Other fatigue: Secondary | ICD-10-CM | POA: Diagnosis not present

## 2018-03-08 DIAGNOSIS — Z0000 Encounter for general adult medical examination without abnormal findings: Secondary | ICD-10-CM | POA: Diagnosis not present

## 2018-03-26 DIAGNOSIS — J449 Chronic obstructive pulmonary disease, unspecified: Secondary | ICD-10-CM | POA: Diagnosis not present

## 2018-03-26 DIAGNOSIS — M545 Low back pain: Secondary | ICD-10-CM | POA: Diagnosis not present

## 2018-03-26 DIAGNOSIS — I714 Abdominal aortic aneurysm, without rupture: Secondary | ICD-10-CM | POA: Diagnosis not present

## 2018-03-26 DIAGNOSIS — I10 Essential (primary) hypertension: Secondary | ICD-10-CM | POA: Diagnosis not present

## 2018-03-26 DIAGNOSIS — Z299 Encounter for prophylactic measures, unspecified: Secondary | ICD-10-CM | POA: Diagnosis not present

## 2018-03-26 DIAGNOSIS — M5136 Other intervertebral disc degeneration, lumbar region: Secondary | ICD-10-CM | POA: Diagnosis not present

## 2018-03-26 DIAGNOSIS — Z6825 Body mass index (BMI) 25.0-25.9, adult: Secondary | ICD-10-CM | POA: Diagnosis not present

## 2018-04-04 DIAGNOSIS — M810 Age-related osteoporosis without current pathological fracture: Secondary | ICD-10-CM | POA: Diagnosis not present

## 2018-04-04 DIAGNOSIS — E2839 Other primary ovarian failure: Secondary | ICD-10-CM | POA: Diagnosis not present

## 2018-04-10 DIAGNOSIS — I10 Essential (primary) hypertension: Secondary | ICD-10-CM | POA: Diagnosis not present

## 2018-04-10 DIAGNOSIS — J449 Chronic obstructive pulmonary disease, unspecified: Secondary | ICD-10-CM | POA: Diagnosis not present

## 2018-04-10 DIAGNOSIS — E7800 Pure hypercholesterolemia, unspecified: Secondary | ICD-10-CM | POA: Diagnosis not present

## 2018-04-22 DIAGNOSIS — E7800 Pure hypercholesterolemia, unspecified: Secondary | ICD-10-CM | POA: Diagnosis not present

## 2018-04-22 DIAGNOSIS — I10 Essential (primary) hypertension: Secondary | ICD-10-CM | POA: Diagnosis not present

## 2018-04-22 DIAGNOSIS — J449 Chronic obstructive pulmonary disease, unspecified: Secondary | ICD-10-CM | POA: Diagnosis not present

## 2018-04-22 DIAGNOSIS — Z6825 Body mass index (BMI) 25.0-25.9, adult: Secondary | ICD-10-CM | POA: Diagnosis not present

## 2018-04-22 DIAGNOSIS — Z299 Encounter for prophylactic measures, unspecified: Secondary | ICD-10-CM | POA: Diagnosis not present

## 2018-04-22 DIAGNOSIS — M810 Age-related osteoporosis without current pathological fracture: Secondary | ICD-10-CM | POA: Diagnosis not present

## 2018-04-22 DIAGNOSIS — K5790 Diverticulosis of intestine, part unspecified, without perforation or abscess without bleeding: Secondary | ICD-10-CM | POA: Diagnosis not present

## 2018-05-20 DIAGNOSIS — Z299 Encounter for prophylactic measures, unspecified: Secondary | ICD-10-CM | POA: Diagnosis not present

## 2018-05-20 DIAGNOSIS — Z6825 Body mass index (BMI) 25.0-25.9, adult: Secondary | ICD-10-CM | POA: Diagnosis not present

## 2018-05-20 DIAGNOSIS — J449 Chronic obstructive pulmonary disease, unspecified: Secondary | ICD-10-CM | POA: Diagnosis not present

## 2018-05-20 DIAGNOSIS — M545 Low back pain: Secondary | ICD-10-CM | POA: Diagnosis not present

## 2018-05-20 DIAGNOSIS — R69 Illness, unspecified: Secondary | ICD-10-CM | POA: Diagnosis not present

## 2018-05-28 DIAGNOSIS — Z6825 Body mass index (BMI) 25.0-25.9, adult: Secondary | ICD-10-CM | POA: Diagnosis not present

## 2018-05-28 DIAGNOSIS — Z299 Encounter for prophylactic measures, unspecified: Secondary | ICD-10-CM | POA: Diagnosis not present

## 2018-05-28 DIAGNOSIS — R69 Illness, unspecified: Secondary | ICD-10-CM | POA: Diagnosis not present

## 2018-05-28 DIAGNOSIS — J449 Chronic obstructive pulmonary disease, unspecified: Secondary | ICD-10-CM | POA: Diagnosis not present

## 2018-05-28 DIAGNOSIS — N183 Chronic kidney disease, stage 3 (moderate): Secondary | ICD-10-CM | POA: Diagnosis not present

## 2018-05-28 DIAGNOSIS — S51819A Laceration without foreign body of unspecified forearm, initial encounter: Secondary | ICD-10-CM | POA: Diagnosis not present

## 2018-06-03 DIAGNOSIS — Z299 Encounter for prophylactic measures, unspecified: Secondary | ICD-10-CM | POA: Diagnosis not present

## 2018-06-03 DIAGNOSIS — B999 Unspecified infectious disease: Secondary | ICD-10-CM | POA: Diagnosis not present

## 2018-06-03 DIAGNOSIS — J449 Chronic obstructive pulmonary disease, unspecified: Secondary | ICD-10-CM | POA: Diagnosis not present

## 2018-06-03 DIAGNOSIS — Z6825 Body mass index (BMI) 25.0-25.9, adult: Secondary | ICD-10-CM | POA: Diagnosis not present

## 2018-06-03 DIAGNOSIS — R69 Illness, unspecified: Secondary | ICD-10-CM | POA: Diagnosis not present

## 2018-06-10 DIAGNOSIS — R252 Cramp and spasm: Secondary | ICD-10-CM | POA: Diagnosis not present

## 2018-06-10 DIAGNOSIS — Z6825 Body mass index (BMI) 25.0-25.9, adult: Secondary | ICD-10-CM | POA: Diagnosis not present

## 2018-06-10 DIAGNOSIS — Z23 Encounter for immunization: Secondary | ICD-10-CM | POA: Diagnosis not present

## 2018-06-10 DIAGNOSIS — Z299 Encounter for prophylactic measures, unspecified: Secondary | ICD-10-CM | POA: Diagnosis not present

## 2018-06-10 DIAGNOSIS — L98499 Non-pressure chronic ulcer of skin of other sites with unspecified severity: Secondary | ICD-10-CM | POA: Diagnosis not present

## 2018-06-10 DIAGNOSIS — I10 Essential (primary) hypertension: Secondary | ICD-10-CM | POA: Diagnosis not present

## 2018-06-13 DIAGNOSIS — S61501A Unspecified open wound of right wrist, initial encounter: Secondary | ICD-10-CM | POA: Diagnosis not present

## 2018-06-13 DIAGNOSIS — S61501D Unspecified open wound of right wrist, subsequent encounter: Secondary | ICD-10-CM | POA: Diagnosis not present

## 2018-06-19 DIAGNOSIS — M810 Age-related osteoporosis without current pathological fracture: Secondary | ICD-10-CM | POA: Diagnosis not present

## 2018-06-20 DIAGNOSIS — S61501A Unspecified open wound of right wrist, initial encounter: Secondary | ICD-10-CM | POA: Diagnosis not present

## 2018-06-20 DIAGNOSIS — S61501D Unspecified open wound of right wrist, subsequent encounter: Secondary | ICD-10-CM | POA: Diagnosis not present

## 2018-06-26 IMAGING — CT CT CTA ABD/PEL W/CM AND/OR W/O CM
3 of 7 series · 10 of 36 positions shown, 16 images · IV contrast (50CC ISOVUE 370)
Comparison: Prior CT abdomen/ pelvis 09/07/2015

CLINICAL DATA: 70-year-old female with a history of abdominal
aortic aneurysm status post open repair

EXAM:
CT ANGIOGRAPHY ABDOMEN AND PELVIS WITH CONTRAST AND WITHOUT CONTRAST
TECHNIQUE: Multidetector CT imaging of the abdomen and pelvis was performed
using the standard protocol during bolus administration of
intravenous contrast. Multiplanar reconstructed images and MIPs were
obtained and reviewed to evaluate the vascular anatomy.
CONTRAST:  50 mL Isovue 370

[Series 4: angio · axial · 0.81mm/px · z∈[-388,-63]mm · 6 of 183 slices shown, 11 images]
[im 27/183  soft-tissue]
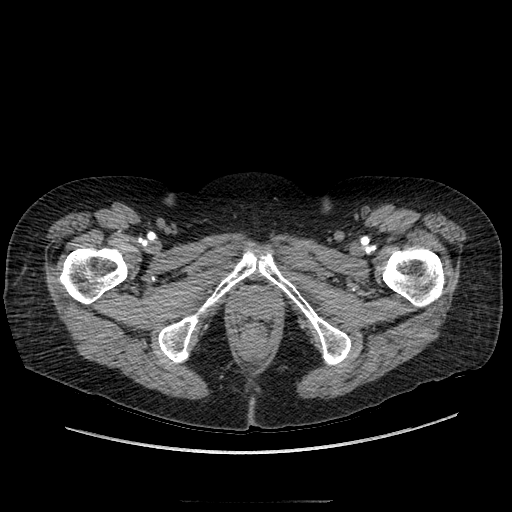
[im 27/183  bone]
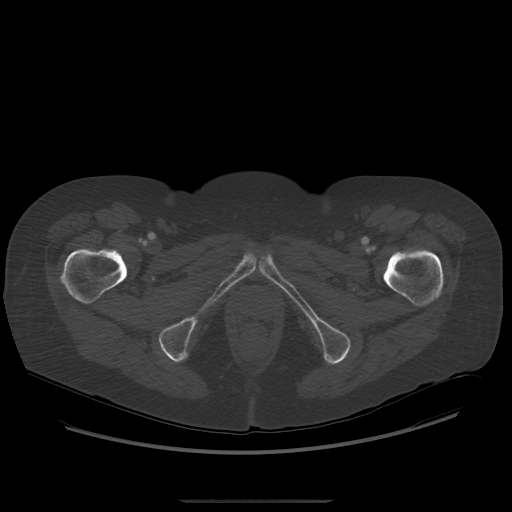
[im 53/183  soft-tissue]
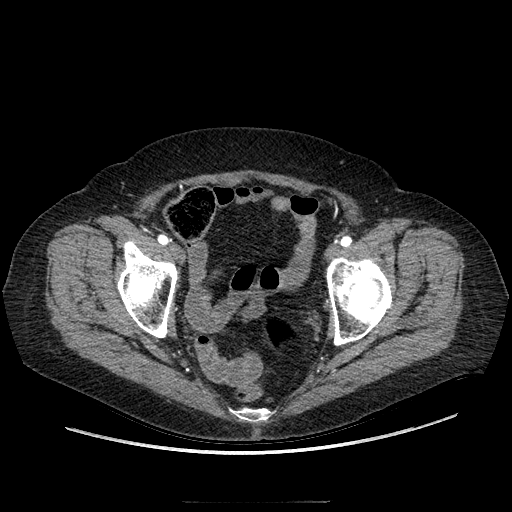
[im 79/183  soft-tissue]
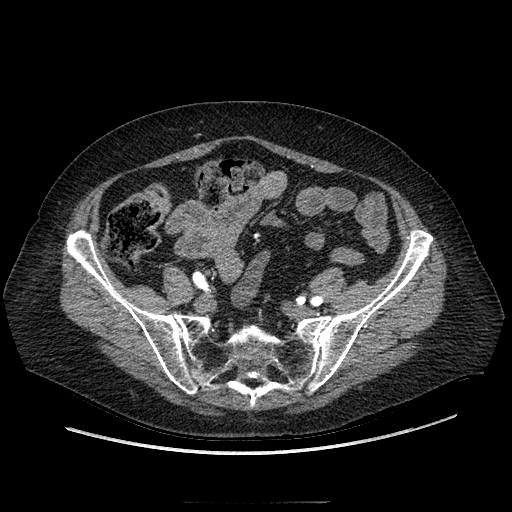
[im 79/183  lung]
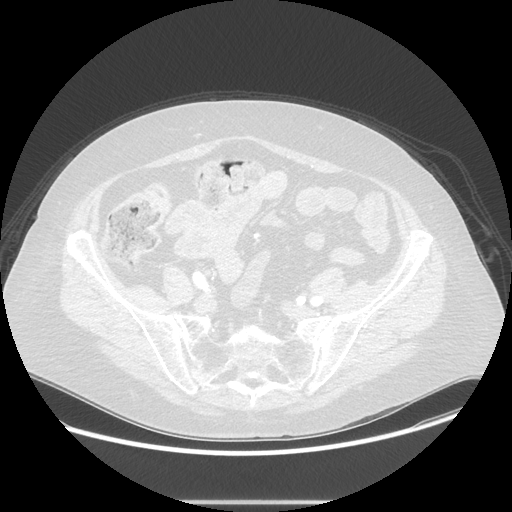
[im 105/183  soft-tissue]
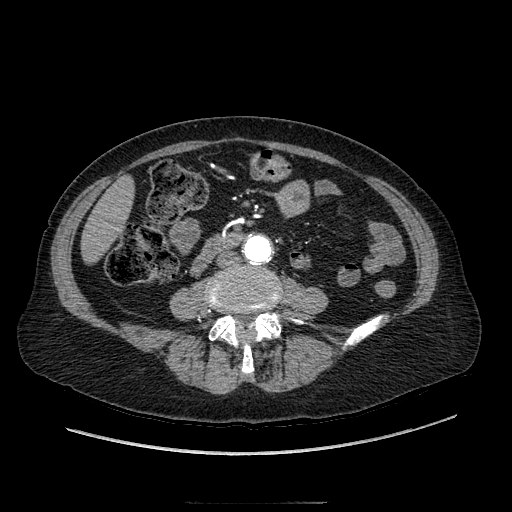
[im 105/183  lung]
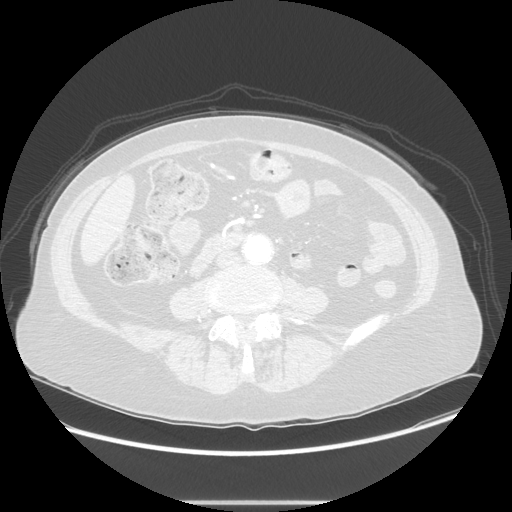
[im 131/183  soft-tissue]
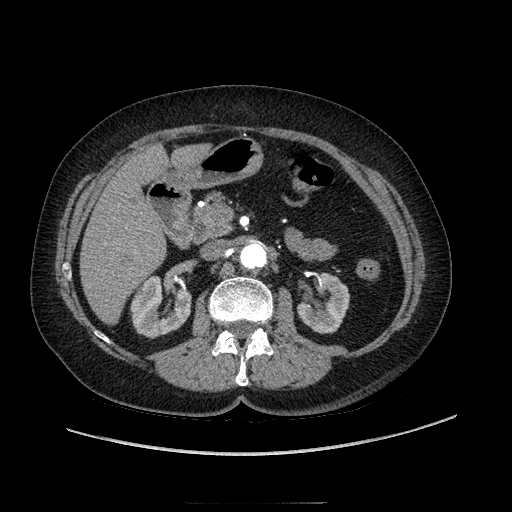
[im 131/183  lung]
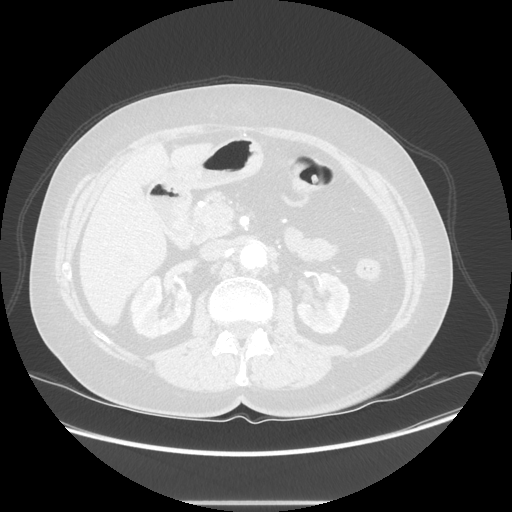
[im 157/183  soft-tissue]
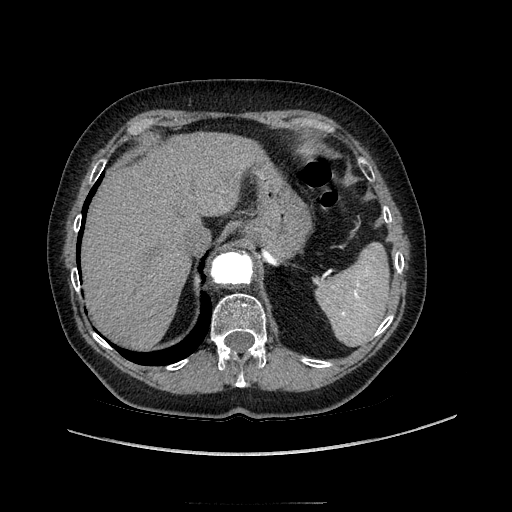
[im 157/183  lung]
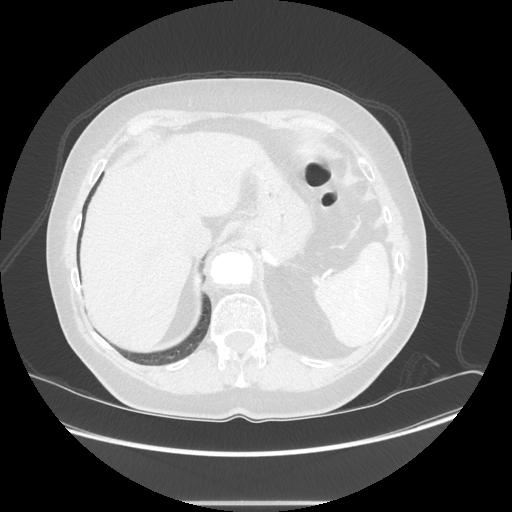

[Series 601: coronal body · coronal · 0.89mm/px · 1 of 121 slices shown, 2 images]
[im 41/121  soft-tissue]
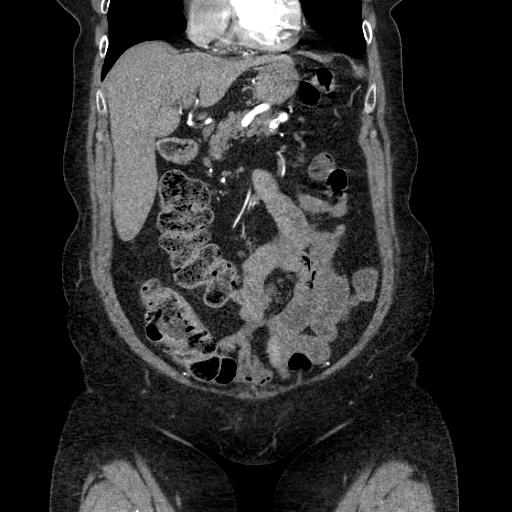
[im 41/121  bone]
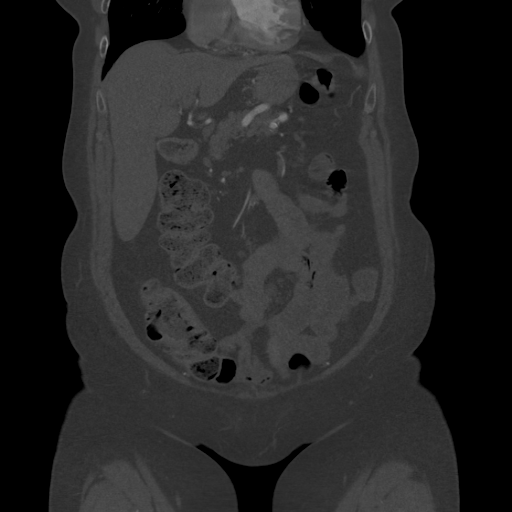

[Series 602: sagittal body · sagittal · 0.89mm/px · 3 of 166 slices shown]
[im 28/166  soft-tissue]
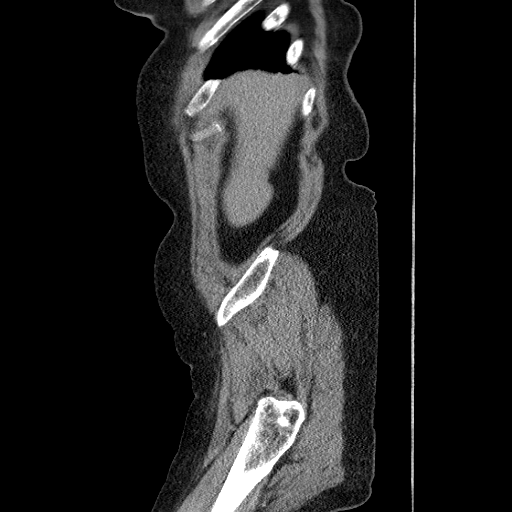
[im 56/166  soft-tissue]
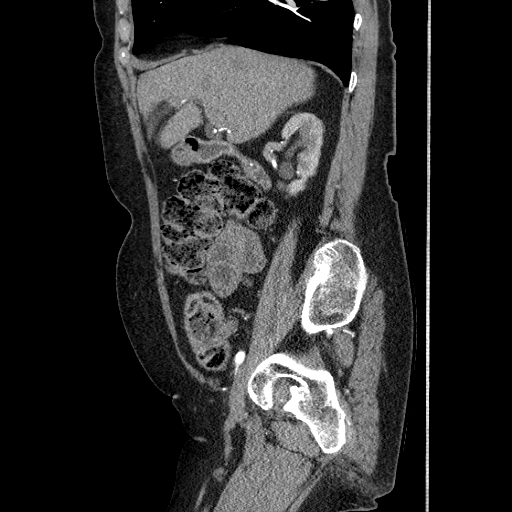
[im 83/166  soft-tissue]
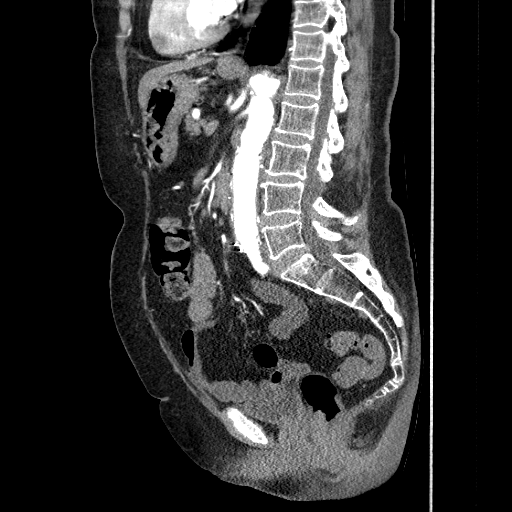

[10 of 36 positions shown; findings below may reference images not displayed]

FINDINGS: VASCULAR

Aorta: Irregular fibrofatty plaque and wall adherent mural thrombus
noted in the visualized descending thoracic aorta. The plaque is
similar to prior imaging. There is a focal penetrating aortic ulcer
at the 9 o'clock position at the level of the inferior left
ventricle. The maximal aortic diameter at this location measures
cm, incrementally enlarged compared to 4.7 cm previously. The
descending aorta remains irregular and ectatic through the aortic
hiatus. The visceral aorta is relatively healthy in comparison.
Surgical changes of prior open tube graft repair of the now removed
infrarenal abdominal aortic aneurysm. The proximal anastomosis is
intact. No evidence aneurysmal dilatation or complication. The
distal anastomosis is also widely patent.

Celiac: Patent and within normal limits. The splenic artery is
tortuous. No aneurysm, dissection or evidence of vasculitis.
Conventional hepatic arterial anatomy.

SMA: Patent without evidence of aneurysm, dissection, vasculitis or
significant stenosis.

Renals: 2 right-sided renal arteries with a small accessory renal
artery going to the upper pole. Solitary left renal artery. All 3
renal arteries are patent. No significant stenosis, aneurysm or
changes of fibromuscular dysplasia.

IMA: Occluded at the origin secondary to prior stent graft repair.
The distal branches reconstitute via collateral flow.

Inflow: Heterogeneous atherosclerotic plaque throughout the native
iliac arteries. No significant abnormality in either common iliac
artery. The internal iliac arteries are patent without significant
stenosis. The external iliac arteries are relatively spared from
disease.

Proximal Outflow: Bilateral common femoral and visualized portions
of the superficial and profunda femoral arteries are patent without
evidence of aneurysm, dissection, vasculitis or significant
stenosis.

Veins: No obvious venous abnormality within the limitations of this
arterial phase study.

Review of the MIP images confirms the above findings.

NON-VASCULAR

Lower chest: The lung bases are clear. Visualized cardiac structures
are within normal limits for size. No pericardial effusion.
Unremarkable visualized distal thoracic esophagus.

Hepatobiliary: Normal hepatic contour and morphology. No new or
suspicious hepatic lesion. Stable circumscribed low-attenuation
lesion in hepatic segment 4B most consistent with a simple cyst. The
gallbladder is surgically absent. No intra or extrahepatic biliary
ductal dilatation.

Pancreas: Unremarkable. No pancreatic ductal dilatation or
surrounding inflammatory changes.

Spleen: Normal in size without focal abnormality.

Adrenals/Urinary Tract: Normal right adrenal gland. Stable benign
adrenal nodule in the left gland. No evidence of nephrolithiasis,
hydronephrosis or enhancing renal mass. Mild renal cortical thinning
bilaterally.

Stomach/Bowel: Colonic diverticular disease without CT evidence of
active inflammation. No evidence of obstruction or focal bowel wall
thickening. Normal appendix in the right lower quadrant. The
terminal ileum is unremarkable.

Lymphatic: No suspicious adenopathy.

Reproductive: Surgical changes of prior hysterectomy. No adnexal
masses.

Other: Small omental fat containing umbilical hernia. Small omental
fat containing midline ventral hernias.

Musculoskeletal: No acute fracture or aggressive appearing lytic or
blastic osseous lesion. A at the irregular aorta is there is the
tube repair
IMPRESSION: VASCULAR

1. Stable surgical changes of prior open tube graft repair of now
removed infrarenal abdominal aortic aneurysm. The proximal and
distal anastomoses are intact without evidence of complicating
feature.
2. Persistent highly irregular and mildly aneurysmal descending
thoracic aorta with a irregular and ulcerated fibrofatty plaque.
There is a small penetrating ulcer at the 9 o'clock position at the
site of maximal diameter (4.8 cm) which is slightly enlarged
compared to 4.7 cm previously.

NON-VASCULAR

1. Ancillary findings as above without significant interval change.

## 2018-07-15 DIAGNOSIS — K6289 Other specified diseases of anus and rectum: Secondary | ICD-10-CM | POA: Diagnosis not present

## 2018-07-15 DIAGNOSIS — R339 Retention of urine, unspecified: Secondary | ICD-10-CM | POA: Diagnosis not present

## 2018-07-15 DIAGNOSIS — Z299 Encounter for prophylactic measures, unspecified: Secondary | ICD-10-CM | POA: Diagnosis not present

## 2018-07-15 DIAGNOSIS — K625 Hemorrhage of anus and rectum: Secondary | ICD-10-CM | POA: Diagnosis not present

## 2018-07-15 DIAGNOSIS — J449 Chronic obstructive pulmonary disease, unspecified: Secondary | ICD-10-CM | POA: Diagnosis not present

## 2018-07-15 DIAGNOSIS — Z6825 Body mass index (BMI) 25.0-25.9, adult: Secondary | ICD-10-CM | POA: Diagnosis not present

## 2018-07-15 DIAGNOSIS — I10 Essential (primary) hypertension: Secondary | ICD-10-CM | POA: Diagnosis not present

## 2018-07-16 DIAGNOSIS — K625 Hemorrhage of anus and rectum: Secondary | ICD-10-CM | POA: Diagnosis not present

## 2018-07-16 DIAGNOSIS — K6289 Other specified diseases of anus and rectum: Secondary | ICD-10-CM | POA: Diagnosis not present

## 2018-07-18 DIAGNOSIS — Z86010 Personal history of colonic polyps: Secondary | ICD-10-CM | POA: Diagnosis not present

## 2018-07-18 DIAGNOSIS — Z79899 Other long term (current) drug therapy: Secondary | ICD-10-CM | POA: Diagnosis not present

## 2018-07-18 DIAGNOSIS — K6289 Other specified diseases of anus and rectum: Secondary | ICD-10-CM | POA: Diagnosis not present

## 2018-07-18 DIAGNOSIS — K529 Noninfective gastroenteritis and colitis, unspecified: Secondary | ICD-10-CM | POA: Diagnosis not present

## 2018-07-18 DIAGNOSIS — D128 Benign neoplasm of rectum: Secondary | ICD-10-CM | POA: Diagnosis not present

## 2018-07-18 DIAGNOSIS — K5289 Other specified noninfective gastroenteritis and colitis: Secondary | ICD-10-CM | POA: Diagnosis not present

## 2018-07-18 DIAGNOSIS — J449 Chronic obstructive pulmonary disease, unspecified: Secondary | ICD-10-CM | POA: Diagnosis not present

## 2018-07-18 DIAGNOSIS — K625 Hemorrhage of anus and rectum: Secondary | ICD-10-CM | POA: Diagnosis not present

## 2018-07-18 DIAGNOSIS — K219 Gastro-esophageal reflux disease without esophagitis: Secondary | ICD-10-CM | POA: Diagnosis not present

## 2018-07-18 DIAGNOSIS — I10 Essential (primary) hypertension: Secondary | ICD-10-CM | POA: Diagnosis not present

## 2018-07-18 DIAGNOSIS — Z7951 Long term (current) use of inhaled steroids: Secondary | ICD-10-CM | POA: Diagnosis not present

## 2018-07-18 DIAGNOSIS — D375 Neoplasm of uncertain behavior of rectum: Secondary | ICD-10-CM | POA: Diagnosis not present

## 2018-07-18 DIAGNOSIS — D126 Benign neoplasm of colon, unspecified: Secondary | ICD-10-CM | POA: Diagnosis not present

## 2018-07-23 DIAGNOSIS — K5730 Diverticulosis of large intestine without perforation or abscess without bleeding: Secondary | ICD-10-CM | POA: Diagnosis not present

## 2018-07-23 DIAGNOSIS — K439 Ventral hernia without obstruction or gangrene: Secondary | ICD-10-CM | POA: Diagnosis not present

## 2018-07-23 DIAGNOSIS — K6289 Other specified diseases of anus and rectum: Secondary | ICD-10-CM | POA: Diagnosis not present

## 2018-07-23 DIAGNOSIS — D3502 Benign neoplasm of left adrenal gland: Secondary | ICD-10-CM | POA: Diagnosis not present

## 2018-07-25 DIAGNOSIS — K6289 Other specified diseases of anus and rectum: Secondary | ICD-10-CM | POA: Diagnosis not present

## 2018-07-30 DIAGNOSIS — E785 Hyperlipidemia, unspecified: Secondary | ICD-10-CM | POA: Diagnosis not present

## 2018-07-30 DIAGNOSIS — K219 Gastro-esophageal reflux disease without esophagitis: Secondary | ICD-10-CM | POA: Diagnosis not present

## 2018-07-30 DIAGNOSIS — Z86010 Personal history of colonic polyps: Secondary | ICD-10-CM | POA: Diagnosis not present

## 2018-07-30 DIAGNOSIS — J449 Chronic obstructive pulmonary disease, unspecified: Secondary | ICD-10-CM | POA: Diagnosis not present

## 2018-07-30 DIAGNOSIS — M1990 Unspecified osteoarthritis, unspecified site: Secondary | ICD-10-CM | POA: Diagnosis not present

## 2018-07-30 DIAGNOSIS — K6289 Other specified diseases of anus and rectum: Secondary | ICD-10-CM | POA: Diagnosis not present

## 2018-07-30 DIAGNOSIS — D128 Benign neoplasm of rectum: Secondary | ICD-10-CM | POA: Diagnosis not present

## 2018-07-30 DIAGNOSIS — M810 Age-related osteoporosis without current pathological fracture: Secondary | ICD-10-CM | POA: Diagnosis not present

## 2018-07-30 DIAGNOSIS — I10 Essential (primary) hypertension: Secondary | ICD-10-CM | POA: Diagnosis not present

## 2018-07-30 DIAGNOSIS — K625 Hemorrhage of anus and rectum: Secondary | ICD-10-CM | POA: Diagnosis not present

## 2018-08-06 DIAGNOSIS — X58XXXA Exposure to other specified factors, initial encounter: Secondary | ICD-10-CM | POA: Diagnosis not present

## 2018-08-06 DIAGNOSIS — Z6825 Body mass index (BMI) 25.0-25.9, adult: Secondary | ICD-10-CM | POA: Diagnosis not present

## 2018-08-06 DIAGNOSIS — R0781 Pleurodynia: Secondary | ICD-10-CM | POA: Diagnosis not present

## 2018-08-06 DIAGNOSIS — S2241XA Multiple fractures of ribs, right side, initial encounter for closed fracture: Secondary | ICD-10-CM | POA: Diagnosis not present

## 2018-08-06 DIAGNOSIS — S2231XA Fracture of one rib, right side, initial encounter for closed fracture: Secondary | ICD-10-CM | POA: Diagnosis not present

## 2018-08-06 DIAGNOSIS — I10 Essential (primary) hypertension: Secondary | ICD-10-CM | POA: Diagnosis not present

## 2018-08-06 DIAGNOSIS — Z299 Encounter for prophylactic measures, unspecified: Secondary | ICD-10-CM | POA: Diagnosis not present

## 2018-08-26 DIAGNOSIS — Z720 Tobacco use: Secondary | ICD-10-CM | POA: Diagnosis not present

## 2018-08-26 DIAGNOSIS — C20 Malignant neoplasm of rectum: Secondary | ICD-10-CM | POA: Diagnosis not present

## 2018-08-26 DIAGNOSIS — J449 Chronic obstructive pulmonary disease, unspecified: Secondary | ICD-10-CM | POA: Diagnosis not present

## 2018-08-27 DIAGNOSIS — M1710 Unilateral primary osteoarthritis, unspecified knee: Secondary | ICD-10-CM | POA: Diagnosis not present

## 2018-08-27 DIAGNOSIS — I714 Abdominal aortic aneurysm, without rupture: Secondary | ICD-10-CM | POA: Diagnosis not present

## 2018-08-27 DIAGNOSIS — Z299 Encounter for prophylactic measures, unspecified: Secondary | ICD-10-CM | POA: Diagnosis not present

## 2018-08-27 DIAGNOSIS — I10 Essential (primary) hypertension: Secondary | ICD-10-CM | POA: Diagnosis not present

## 2018-08-27 DIAGNOSIS — J449 Chronic obstructive pulmonary disease, unspecified: Secondary | ICD-10-CM | POA: Diagnosis not present

## 2018-08-27 DIAGNOSIS — Z6825 Body mass index (BMI) 25.0-25.9, adult: Secondary | ICD-10-CM | POA: Diagnosis not present

## 2018-08-27 DIAGNOSIS — M25572 Pain in left ankle and joints of left foot: Secondary | ICD-10-CM | POA: Diagnosis not present

## 2018-08-28 ENCOUNTER — Other Ambulatory Visit: Payer: Self-pay | Admitting: Oncology

## 2018-08-28 DIAGNOSIS — C20 Malignant neoplasm of rectum: Secondary | ICD-10-CM

## 2018-08-29 DIAGNOSIS — D369 Benign neoplasm, unspecified site: Secondary | ICD-10-CM | POA: Diagnosis not present

## 2018-09-02 ENCOUNTER — Encounter
Admission: RE | Admit: 2018-09-02 | Discharge: 2018-09-02 | Disposition: A | Discharge status: Home / Self Care | Payer: Medicare HMO | Source: Ambulatory Visit | Attending: Oncology | Admitting: Oncology

## 2018-09-02 DIAGNOSIS — C20 Malignant neoplasm of rectum: Secondary | ICD-10-CM | POA: Diagnosis present

## 2018-09-02 DIAGNOSIS — C218 Malignant neoplasm of overlapping sites of rectum, anus and anal canal: Secondary | ICD-10-CM | POA: Diagnosis not present

## 2018-09-02 LAB — GLUCOSE, CAPILLARY: Glucose-Capillary: 116 mg/dL — ABNORMAL HIGH (ref 70–99)

## 2018-09-02 MED ORDER — FLUDEOXYGLUCOSE F - 18 (FDG) INJECTION
8.4000 | Freq: Once | INTRAVENOUS | Status: AC | PRN
  Administered 2018-09-02: 8.4 via INTRAVENOUS

## 2018-09-09 DIAGNOSIS — R69 Illness, unspecified: Secondary | ICD-10-CM | POA: Diagnosis not present

## 2018-09-09 DIAGNOSIS — Z9049 Acquired absence of other specified parts of digestive tract: Secondary | ICD-10-CM | POA: Diagnosis not present

## 2018-09-09 DIAGNOSIS — C20 Malignant neoplasm of rectum: Secondary | ICD-10-CM | POA: Diagnosis not present

## 2018-09-09 DIAGNOSIS — J449 Chronic obstructive pulmonary disease, unspecified: Secondary | ICD-10-CM | POA: Diagnosis not present

## 2018-09-09 DIAGNOSIS — Z720 Tobacco use: Secondary | ICD-10-CM | POA: Diagnosis not present

## 2018-09-12 DIAGNOSIS — R69 Illness, unspecified: Secondary | ICD-10-CM | POA: Diagnosis not present

## 2018-09-12 DIAGNOSIS — Z885 Allergy status to narcotic agent status: Secondary | ICD-10-CM | POA: Diagnosis not present

## 2018-09-12 DIAGNOSIS — J449 Chronic obstructive pulmonary disease, unspecified: Secondary | ICD-10-CM | POA: Diagnosis not present

## 2018-09-12 DIAGNOSIS — I714 Abdominal aortic aneurysm, without rupture: Secondary | ICD-10-CM | POA: Diagnosis not present

## 2018-09-12 DIAGNOSIS — Z720 Tobacco use: Secondary | ICD-10-CM | POA: Diagnosis not present

## 2018-09-12 DIAGNOSIS — C20 Malignant neoplasm of rectum: Secondary | ICD-10-CM | POA: Diagnosis not present

## 2018-09-12 DIAGNOSIS — Z9889 Other specified postprocedural states: Secondary | ICD-10-CM | POA: Diagnosis not present

## 2018-09-12 DIAGNOSIS — Z7951 Long term (current) use of inhaled steroids: Secondary | ICD-10-CM | POA: Diagnosis not present

## 2018-09-12 DIAGNOSIS — Z79899 Other long term (current) drug therapy: Secondary | ICD-10-CM | POA: Diagnosis not present

## 2018-09-13 DIAGNOSIS — C20 Malignant neoplasm of rectum: Secondary | ICD-10-CM | POA: Diagnosis not present

## 2018-09-16 DIAGNOSIS — J449 Chronic obstructive pulmonary disease, unspecified: Secondary | ICD-10-CM | POA: Diagnosis not present

## 2018-09-16 DIAGNOSIS — I10 Essential (primary) hypertension: Secondary | ICD-10-CM | POA: Diagnosis not present

## 2018-09-16 DIAGNOSIS — E7800 Pure hypercholesterolemia, unspecified: Secondary | ICD-10-CM | POA: Diagnosis not present

## 2018-09-18 DIAGNOSIS — J449 Chronic obstructive pulmonary disease, unspecified: Secondary | ICD-10-CM | POA: Diagnosis not present

## 2018-09-18 DIAGNOSIS — Z885 Allergy status to narcotic agent status: Secondary | ICD-10-CM | POA: Diagnosis not present

## 2018-09-18 DIAGNOSIS — R69 Illness, unspecified: Secondary | ICD-10-CM | POA: Diagnosis not present

## 2018-09-18 DIAGNOSIS — C20 Malignant neoplasm of rectum: Secondary | ICD-10-CM | POA: Diagnosis not present

## 2018-09-18 DIAGNOSIS — E785 Hyperlipidemia, unspecified: Secondary | ICD-10-CM | POA: Diagnosis not present

## 2018-09-18 DIAGNOSIS — D128 Benign neoplasm of rectum: Secondary | ICD-10-CM | POA: Diagnosis not present

## 2018-09-18 DIAGNOSIS — D490 Neoplasm of unspecified behavior of digestive system: Secondary | ICD-10-CM | POA: Diagnosis not present

## 2018-09-18 DIAGNOSIS — I10 Essential (primary) hypertension: Secondary | ICD-10-CM | POA: Diagnosis not present

## 2018-09-18 DIAGNOSIS — K219 Gastro-esophageal reflux disease without esophagitis: Secondary | ICD-10-CM | POA: Diagnosis not present

## 2018-09-18 DIAGNOSIS — Z9049 Acquired absence of other specified parts of digestive tract: Secondary | ICD-10-CM | POA: Diagnosis not present

## 2018-09-18 DIAGNOSIS — Z79899 Other long term (current) drug therapy: Secondary | ICD-10-CM | POA: Diagnosis not present

## 2018-09-19 DIAGNOSIS — I10 Essential (primary) hypertension: Secondary | ICD-10-CM | POA: Diagnosis not present

## 2018-09-19 DIAGNOSIS — R338 Other retention of urine: Secondary | ICD-10-CM | POA: Diagnosis not present

## 2018-09-19 DIAGNOSIS — R300 Dysuria: Secondary | ICD-10-CM | POA: Diagnosis not present

## 2018-09-19 DIAGNOSIS — R69 Illness, unspecified: Secondary | ICD-10-CM | POA: Diagnosis not present

## 2018-09-19 DIAGNOSIS — Z79899 Other long term (current) drug therapy: Secondary | ICD-10-CM | POA: Diagnosis not present

## 2018-09-19 DIAGNOSIS — Z9889 Other specified postprocedural states: Secondary | ICD-10-CM | POA: Diagnosis not present

## 2018-09-19 DIAGNOSIS — E785 Hyperlipidemia, unspecified: Secondary | ICD-10-CM | POA: Diagnosis not present

## 2018-09-19 DIAGNOSIS — K219 Gastro-esophageal reflux disease without esophagitis: Secondary | ICD-10-CM | POA: Diagnosis not present

## 2018-09-19 DIAGNOSIS — J449 Chronic obstructive pulmonary disease, unspecified: Secondary | ICD-10-CM | POA: Diagnosis not present

## 2018-09-19 DIAGNOSIS — R339 Retention of urine, unspecified: Secondary | ICD-10-CM | POA: Diagnosis not present

## 2018-09-19 DIAGNOSIS — Z7982 Long term (current) use of aspirin: Secondary | ICD-10-CM | POA: Diagnosis not present

## 2018-09-24 DIAGNOSIS — D128 Benign neoplasm of rectum: Secondary | ICD-10-CM | POA: Diagnosis not present

## 2018-09-24 DIAGNOSIS — C20 Malignant neoplasm of rectum: Secondary | ICD-10-CM | POA: Diagnosis not present

## 2018-09-24 DIAGNOSIS — Z720 Tobacco use: Secondary | ICD-10-CM | POA: Diagnosis not present

## 2018-09-26 DIAGNOSIS — I10 Essential (primary) hypertension: Secondary | ICD-10-CM | POA: Diagnosis not present

## 2018-09-26 DIAGNOSIS — J449 Chronic obstructive pulmonary disease, unspecified: Secondary | ICD-10-CM | POA: Diagnosis not present

## 2018-09-26 DIAGNOSIS — Z79899 Other long term (current) drug therapy: Secondary | ICD-10-CM | POA: Diagnosis not present

## 2018-09-26 DIAGNOSIS — Z85048 Personal history of other malignant neoplasm of rectum, rectosigmoid junction, and anus: Secondary | ICD-10-CM | POA: Diagnosis not present

## 2018-09-26 DIAGNOSIS — E785 Hyperlipidemia, unspecified: Secondary | ICD-10-CM | POA: Diagnosis not present

## 2018-09-26 DIAGNOSIS — N390 Urinary tract infection, site not specified: Secondary | ICD-10-CM | POA: Diagnosis not present

## 2018-09-26 DIAGNOSIS — R319 Hematuria, unspecified: Secondary | ICD-10-CM | POA: Diagnosis not present

## 2018-09-26 DIAGNOSIS — K219 Gastro-esophageal reflux disease without esophagitis: Secondary | ICD-10-CM | POA: Diagnosis not present

## 2018-09-26 DIAGNOSIS — R69 Illness, unspecified: Secondary | ICD-10-CM | POA: Diagnosis not present

## 2018-09-26 DIAGNOSIS — M810 Age-related osteoporosis without current pathological fracture: Secondary | ICD-10-CM | POA: Diagnosis not present

## 2018-10-02 DIAGNOSIS — N390 Urinary tract infection, site not specified: Secondary | ICD-10-CM | POA: Diagnosis not present

## 2018-10-02 DIAGNOSIS — I10 Essential (primary) hypertension: Secondary | ICD-10-CM | POA: Diagnosis not present

## 2018-10-02 DIAGNOSIS — Z6824 Body mass index (BMI) 24.0-24.9, adult: Secondary | ICD-10-CM | POA: Diagnosis not present

## 2018-10-02 DIAGNOSIS — Z299 Encounter for prophylactic measures, unspecified: Secondary | ICD-10-CM | POA: Diagnosis not present

## 2018-10-03 ENCOUNTER — Other Ambulatory Visit: Payer: Self-pay

## 2018-10-03 DIAGNOSIS — I712 Thoracic aortic aneurysm, without rupture, unspecified: Secondary | ICD-10-CM

## 2018-10-14 DIAGNOSIS — I10 Essential (primary) hypertension: Secondary | ICD-10-CM | POA: Diagnosis not present

## 2018-10-14 DIAGNOSIS — J449 Chronic obstructive pulmonary disease, unspecified: Secondary | ICD-10-CM | POA: Diagnosis not present

## 2018-10-14 DIAGNOSIS — E7800 Pure hypercholesterolemia, unspecified: Secondary | ICD-10-CM | POA: Diagnosis not present

## 2018-10-29 ENCOUNTER — Ambulatory Visit (HOSPITAL_COMMUNITY): Admission: RE | Admit: 2018-10-29 | Payer: Medicare HMO | Source: Ambulatory Visit

## 2018-10-31 DIAGNOSIS — Z6824 Body mass index (BMI) 24.0-24.9, adult: Secondary | ICD-10-CM | POA: Diagnosis not present

## 2018-10-31 DIAGNOSIS — I10 Essential (primary) hypertension: Secondary | ICD-10-CM | POA: Diagnosis not present

## 2018-10-31 DIAGNOSIS — J449 Chronic obstructive pulmonary disease, unspecified: Secondary | ICD-10-CM | POA: Diagnosis not present

## 2018-10-31 DIAGNOSIS — Z299 Encounter for prophylactic measures, unspecified: Secondary | ICD-10-CM | POA: Diagnosis not present

## 2018-10-31 DIAGNOSIS — R509 Fever, unspecified: Secondary | ICD-10-CM | POA: Diagnosis not present

## 2018-10-31 DIAGNOSIS — J069 Acute upper respiratory infection, unspecified: Secondary | ICD-10-CM | POA: Diagnosis not present

## 2018-11-05 ENCOUNTER — Ambulatory Visit: Payer: Medicare HMO | Admitting: Vascular Surgery

## 2018-11-11 ENCOUNTER — Ambulatory Visit (HOSPITAL_COMMUNITY)
Admission: RE | Admit: 2018-11-11 | Discharge: 2018-11-11 | Disposition: A | Discharge status: Home / Self Care | Payer: Medicare HMO | Source: Ambulatory Visit | Attending: Vascular Surgery | Admitting: Vascular Surgery

## 2018-11-11 DIAGNOSIS — I712 Thoracic aortic aneurysm, without rupture, unspecified: Secondary | ICD-10-CM

## 2018-11-11 LAB — POCT I-STAT CREATININE: Creatinine, Ser: 1.1 mg/dL — ABNORMAL HIGH (ref 0.44–1.00)

## 2018-11-11 MED ORDER — IOHEXOL 350 MG/ML SOLN
100.0000 mL | Freq: Once | INTRAVENOUS | Status: AC | PRN
  Administered 2018-11-11: 100 mL via INTRAVENOUS

## 2018-11-19 ENCOUNTER — Ambulatory Visit: Payer: Medicare HMO | Admitting: Vascular Surgery

## 2018-11-19 ENCOUNTER — Other Ambulatory Visit: Payer: Self-pay

## 2018-11-19 ENCOUNTER — Encounter: Payer: Self-pay | Admitting: Vascular Surgery

## 2018-11-19 VITALS — BP 115/62 | HR 79 | Temp 97.9°F | Resp 16 | Ht 67.0 in | Wt 153.7 lb

## 2018-11-19 DIAGNOSIS — I712 Thoracic aortic aneurysm, without rupture, unspecified: Secondary | ICD-10-CM

## 2018-11-19 NOTE — Progress Notes (Signed)
Vascular and Vein Specialist of [facility]  Patient name: [name] MRN: [id number] DOB: 23-Aug-1946 Sex: female  REASON FOR VISIT: Follow-up thoracic aneurysm and endovascular repair of abdominal aortic aneurysm  HPI: [name] is a 73 y.o. female here today for follow-up.  She remained stable from our last visit 2 years ago.  She denies any new cardiac difficulty.  Does have moderate COPD related to ongoing cigarette abuse.  He has no symptoms to her thoracic aneurysm or aortic abdominal aortic aneurysm  Past Medical History:  Diagnosis Date  . AAA (abdominal aortic aneurysm) ([place])   . GERD (gastroesophageal reflux disease)   . Hyperlipidemia   . Hypertension   . Moderate COPD (chronic obstructive pulmonary disease) ([place])   . Osteoporosis   . Tobacco abuse   . Vertigo     History reviewed. No pertinent family history.  SOCIAL HISTORY: Social History   Tobacco Use  . Smoking status: Current Every Day Smoker    Packs/day: 1.00    Years: 50.00    Pack years: 50.00    Types: Cigarettes  . Smokeless tobacco: Never Used  Substance Use Topics  . Alcohol use: Not on file    Allergies  Allergen Reactions  . Tramadol Itching    Current Outpatient Medications  Medication Sig Dispense Refill  . albuterol (PROVENTIL HFA;VENTOLIN HFA) 108 (90 BASE) MCG/ACT inhaler Inhale 2 puffs into the lungs every 6 (six) hours as needed for wheezing or shortness of breath.    . baclofen (LIORESAL) 10 MG tablet Take 10 mg by mouth 3 (three) times daily.    . Calcium Citrate-Vitamin D (CALCIUM + D PO) Take by mouth.    . denosumab (PROLIA) 60 MG/ML SOSY injection Inject into the skin.    Marland Kitchen Fluticasone-Salmeterol (ADVAIR) 100-50 MCG/DOSE AEPB Inhale 1 puff into the lungs as needed.     Marland Kitchen losartan (COZAAR) 50 MG tablet Take by mouth.    . metoprolol succinate (TOPROL-XL) 50 MG 24 hr tablet Take 50 mg by mouth daily. Take with or immediately following  a meal.     . omeprazole (PRILOSEC) 20 MG capsule Take by mouth.    . simvastatin (ZOCOR) 20 MG tablet Take 20 mg by mouth daily.    Marland Kitchen tiZANidine (ZANAFLEX) 4 MG tablet Take 4 mg by mouth every 6 (six) hours as needed for muscle spasms. Reported on 09/14/2015    . valsartan (DIOVAN) 160 MG tablet Take 160 mg by mouth daily.     No current facility-administered medications for this visit.     REVIEW OF SYSTEMS:  [X]  denotes positive finding, [ ]  denotes negative finding Cardiac  Comments:  Chest pain or chest pressure:    Shortness of breath upon exertion:    Short of breath when lying flat:    Irregular heart rhythm:        Vascular    Pain in calf, thigh, or hip brought on by ambulation:    Pain in feet at night that wakes you up from your sleep:     Blood clot in your veins:    Leg swelling:           PHYSICAL EXAM: Vitals:   11/19/18 1333  BP: 115/62  Pulse: 79  Resp: 16  Temp: 97.9 F (36.6 C)  TempSrc: Oral  SpO2: 98%  Weight: 153 lb 10.6 oz (69.7 kg)  Height: 5\' 7"  (1.702 m)    GENERAL: The patient is a well-nourished  female, in no acute distress. The vital signs are documented above. CARDIOVASCULAR: Carotid arteries are without bruits bilaterally.  2+ radial pulses bilaterally. PULMONARY: There is good air exchange  MUSCULOSKELETAL: There are no major deformities or cyanosis. NEUROLOGIC: No focal weakness or paresthesias are detected. SKIN: There are no ulcers or rashes noted. PSYCHIATRIC: The patient has a normal affect.  DATA:  CT scan reveals thoracic aneurysm at her aortic hiatus.  This is unchanged from her CT 2 years ago in January 2018 with a maximal diameter of 5.0 cm.  She has a stable infrarenal graft with no evidence of expansion of her aortic sac  MEDICAL ISSUES: I discussed these findings with the patient.  I do not see any evidence of expansion of her thoracic aneurysm.  Would recommend seeing her again in 2 years.  This is at her aortic hiatus  and would be difficult to treat with's traditional standard stent grafting.  She understands symptoms of leaking aneurysm and knows report immediately to the hospital should this occur    Rosetta Posner, MD [facility] Vascular and Vein Specialists of [facility] Tel [phone] Pager [phone]

## 2018-12-02 DIAGNOSIS — E7800 Pure hypercholesterolemia, unspecified: Secondary | ICD-10-CM | POA: Diagnosis not present

## 2018-12-02 DIAGNOSIS — I10 Essential (primary) hypertension: Secondary | ICD-10-CM | POA: Diagnosis not present

## 2018-12-02 DIAGNOSIS — J449 Chronic obstructive pulmonary disease, unspecified: Secondary | ICD-10-CM | POA: Diagnosis not present

## 2018-12-09 NOTE — Addendum Note (Signed)
Encounter addended by: Annie Paras on: 12/09/2018 1:34 PM  Actions taken: Imaging Exam begun

## 2018-12-09 NOTE — Addendum Note (Signed)
Encounter addended by: Annie Paras on: 12/09/2018 1:33 PM  Actions taken: Imaging Exam begun

## 2018-12-16 DIAGNOSIS — R69 Illness, unspecified: Secondary | ICD-10-CM | POA: Diagnosis not present

## 2018-12-16 DIAGNOSIS — J449 Chronic obstructive pulmonary disease, unspecified: Secondary | ICD-10-CM | POA: Diagnosis not present

## 2018-12-16 DIAGNOSIS — Z299 Encounter for prophylactic measures, unspecified: Secondary | ICD-10-CM | POA: Diagnosis not present

## 2018-12-16 DIAGNOSIS — I10 Essential (primary) hypertension: Secondary | ICD-10-CM | POA: Diagnosis not present

## 2018-12-16 DIAGNOSIS — J441 Chronic obstructive pulmonary disease with (acute) exacerbation: Secondary | ICD-10-CM | POA: Diagnosis not present

## 2019-01-02 DIAGNOSIS — M810 Age-related osteoporosis without current pathological fracture: Secondary | ICD-10-CM | POA: Diagnosis not present

## 2019-01-03 DIAGNOSIS — Z720 Tobacco use: Secondary | ICD-10-CM | POA: Diagnosis not present

## 2019-01-03 DIAGNOSIS — D128 Benign neoplasm of rectum: Secondary | ICD-10-CM | POA: Diagnosis not present

## 2019-01-17 DIAGNOSIS — J441 Chronic obstructive pulmonary disease with (acute) exacerbation: Secondary | ICD-10-CM | POA: Diagnosis not present

## 2019-01-17 DIAGNOSIS — N183 Chronic kidney disease, stage 3 (moderate): Secondary | ICD-10-CM | POA: Diagnosis not present

## 2019-01-17 DIAGNOSIS — I714 Abdominal aortic aneurysm, without rupture: Secondary | ICD-10-CM | POA: Diagnosis not present

## 2019-01-17 DIAGNOSIS — R69 Illness, unspecified: Secondary | ICD-10-CM | POA: Diagnosis not present

## 2019-01-17 DIAGNOSIS — J449 Chronic obstructive pulmonary disease, unspecified: Secondary | ICD-10-CM | POA: Diagnosis not present

## 2019-02-10 DIAGNOSIS — J449 Chronic obstructive pulmonary disease, unspecified: Secondary | ICD-10-CM | POA: Diagnosis not present

## 2019-02-10 DIAGNOSIS — Z299 Encounter for prophylactic measures, unspecified: Secondary | ICD-10-CM | POA: Diagnosis not present

## 2019-02-10 DIAGNOSIS — N183 Chronic kidney disease, stage 3 (moderate): Secondary | ICD-10-CM | POA: Diagnosis not present

## 2019-02-10 DIAGNOSIS — R600 Localized edema: Secondary | ICD-10-CM | POA: Diagnosis not present

## 2019-02-10 DIAGNOSIS — I714 Abdominal aortic aneurysm, without rupture: Secondary | ICD-10-CM | POA: Diagnosis not present

## 2019-02-20 DIAGNOSIS — Z85828 Personal history of other malignant neoplasm of skin: Secondary | ICD-10-CM | POA: Diagnosis not present

## 2019-02-20 DIAGNOSIS — C44319 Basal cell carcinoma of skin of other parts of face: Secondary | ICD-10-CM | POA: Diagnosis not present

## 2019-02-20 DIAGNOSIS — L820 Inflamed seborrheic keratosis: Secondary | ICD-10-CM | POA: Diagnosis not present

## 2019-02-20 DIAGNOSIS — D485 Neoplasm of uncertain behavior of skin: Secondary | ICD-10-CM | POA: Diagnosis not present

## 2019-03-03 DIAGNOSIS — J449 Chronic obstructive pulmonary disease, unspecified: Secondary | ICD-10-CM | POA: Diagnosis not present

## 2019-03-03 DIAGNOSIS — I714 Abdominal aortic aneurysm, without rupture: Secondary | ICD-10-CM | POA: Diagnosis not present

## 2019-03-03 DIAGNOSIS — J441 Chronic obstructive pulmonary disease with (acute) exacerbation: Secondary | ICD-10-CM | POA: Diagnosis not present

## 2019-03-03 DIAGNOSIS — R21 Rash and other nonspecific skin eruption: Secondary | ICD-10-CM | POA: Diagnosis not present

## 2019-03-03 DIAGNOSIS — Z299 Encounter for prophylactic measures, unspecified: Secondary | ICD-10-CM | POA: Diagnosis not present

## 2019-03-06 DIAGNOSIS — C44319 Basal cell carcinoma of skin of other parts of face: Secondary | ICD-10-CM | POA: Diagnosis not present

## 2019-03-06 DIAGNOSIS — L259 Unspecified contact dermatitis, unspecified cause: Secondary | ICD-10-CM | POA: Diagnosis not present

## 2019-03-10 DIAGNOSIS — D128 Benign neoplasm of rectum: Secondary | ICD-10-CM | POA: Diagnosis not present

## 2019-03-10 DIAGNOSIS — C20 Malignant neoplasm of rectum: Secondary | ICD-10-CM | POA: Diagnosis not present

## 2019-03-10 DIAGNOSIS — J449 Chronic obstructive pulmonary disease, unspecified: Secondary | ICD-10-CM | POA: Diagnosis not present

## 2019-03-10 DIAGNOSIS — Z9049 Acquired absence of other specified parts of digestive tract: Secondary | ICD-10-CM | POA: Diagnosis not present

## 2019-03-10 DIAGNOSIS — I10 Essential (primary) hypertension: Secondary | ICD-10-CM | POA: Diagnosis not present

## 2019-03-11 DIAGNOSIS — Z299 Encounter for prophylactic measures, unspecified: Secondary | ICD-10-CM | POA: Diagnosis not present

## 2019-03-11 DIAGNOSIS — N183 Chronic kidney disease, stage 3 (moderate): Secondary | ICD-10-CM | POA: Diagnosis not present

## 2019-03-11 DIAGNOSIS — J449 Chronic obstructive pulmonary disease, unspecified: Secondary | ICD-10-CM | POA: Diagnosis not present

## 2019-03-11 DIAGNOSIS — M25512 Pain in left shoulder: Secondary | ICD-10-CM | POA: Diagnosis not present

## 2019-03-11 DIAGNOSIS — I714 Abdominal aortic aneurysm, without rupture: Secondary | ICD-10-CM | POA: Diagnosis not present

## 2019-03-21 DIAGNOSIS — M25512 Pain in left shoulder: Secondary | ICD-10-CM | POA: Diagnosis not present

## 2019-03-21 DIAGNOSIS — I714 Abdominal aortic aneurysm, without rupture: Secondary | ICD-10-CM | POA: Diagnosis not present

## 2019-03-21 DIAGNOSIS — J449 Chronic obstructive pulmonary disease, unspecified: Secondary | ICD-10-CM | POA: Diagnosis not present

## 2019-03-21 DIAGNOSIS — Z1159 Encounter for screening for other viral diseases: Secondary | ICD-10-CM | POA: Diagnosis not present

## 2019-03-21 DIAGNOSIS — N183 Chronic kidney disease, stage 3 (moderate): Secondary | ICD-10-CM | POA: Diagnosis not present

## 2019-03-21 DIAGNOSIS — Z299 Encounter for prophylactic measures, unspecified: Secondary | ICD-10-CM | POA: Diagnosis not present

## 2019-03-21 DIAGNOSIS — Z6825 Body mass index (BMI) 25.0-25.9, adult: Secondary | ICD-10-CM | POA: Diagnosis not present

## 2019-03-24 DIAGNOSIS — Z9049 Acquired absence of other specified parts of digestive tract: Secondary | ICD-10-CM | POA: Diagnosis not present

## 2019-03-24 DIAGNOSIS — K219 Gastro-esophageal reflux disease without esophagitis: Secondary | ICD-10-CM | POA: Diagnosis not present

## 2019-03-24 DIAGNOSIS — E785 Hyperlipidemia, unspecified: Secondary | ICD-10-CM | POA: Diagnosis not present

## 2019-03-24 DIAGNOSIS — K6389 Other specified diseases of intestine: Secondary | ICD-10-CM | POA: Diagnosis not present

## 2019-03-24 DIAGNOSIS — I10 Essential (primary) hypertension: Secondary | ICD-10-CM | POA: Diagnosis not present

## 2019-03-24 DIAGNOSIS — Z85828 Personal history of other malignant neoplasm of skin: Secondary | ICD-10-CM | POA: Diagnosis not present

## 2019-03-24 DIAGNOSIS — J449 Chronic obstructive pulmonary disease, unspecified: Secondary | ICD-10-CM | POA: Diagnosis not present

## 2019-03-24 DIAGNOSIS — Z85048 Personal history of other malignant neoplasm of rectum, rectosigmoid junction, and anus: Secondary | ICD-10-CM | POA: Diagnosis not present

## 2019-03-24 DIAGNOSIS — K6289 Other specified diseases of anus and rectum: Secondary | ICD-10-CM | POA: Diagnosis not present

## 2019-03-24 DIAGNOSIS — R69 Illness, unspecified: Secondary | ICD-10-CM | POA: Diagnosis not present

## 2019-04-01 DIAGNOSIS — D128 Benign neoplasm of rectum: Secondary | ICD-10-CM | POA: Diagnosis not present

## 2019-04-01 DIAGNOSIS — C20 Malignant neoplasm of rectum: Secondary | ICD-10-CM | POA: Diagnosis not present

## 2019-04-01 DIAGNOSIS — Z720 Tobacco use: Secondary | ICD-10-CM | POA: Diagnosis not present

## 2019-04-04 DIAGNOSIS — M25512 Pain in left shoulder: Secondary | ICD-10-CM | POA: Diagnosis not present

## 2019-04-15 DIAGNOSIS — M25512 Pain in left shoulder: Secondary | ICD-10-CM | POA: Diagnosis not present

## 2019-04-15 DIAGNOSIS — M19012 Primary osteoarthritis, left shoulder: Secondary | ICD-10-CM | POA: Diagnosis not present

## 2019-04-18 DIAGNOSIS — M25512 Pain in left shoulder: Secondary | ICD-10-CM | POA: Diagnosis not present

## 2019-04-22 DIAGNOSIS — R69 Illness, unspecified: Secondary | ICD-10-CM | POA: Diagnosis not present

## 2019-04-22 DIAGNOSIS — Z6824 Body mass index (BMI) 24.0-24.9, adult: Secondary | ICD-10-CM | POA: Diagnosis not present

## 2019-04-22 DIAGNOSIS — J449 Chronic obstructive pulmonary disease, unspecified: Secondary | ICD-10-CM | POA: Diagnosis not present

## 2019-04-22 DIAGNOSIS — N183 Chronic kidney disease, stage 3 (moderate): Secondary | ICD-10-CM | POA: Diagnosis not present

## 2019-04-22 DIAGNOSIS — Z299 Encounter for prophylactic measures, unspecified: Secondary | ICD-10-CM | POA: Diagnosis not present

## 2019-04-22 DIAGNOSIS — I714 Abdominal aortic aneurysm, without rupture: Secondary | ICD-10-CM | POA: Diagnosis not present

## 2019-04-22 DIAGNOSIS — M25512 Pain in left shoulder: Secondary | ICD-10-CM | POA: Diagnosis not present

## 2019-05-02 ENCOUNTER — Other Ambulatory Visit: Payer: Self-pay | Admitting: Orthopaedic Surgery

## 2019-05-02 DIAGNOSIS — M25512 Pain in left shoulder: Secondary | ICD-10-CM

## 2019-05-06 DIAGNOSIS — I10 Essential (primary) hypertension: Secondary | ICD-10-CM | POA: Diagnosis not present

## 2019-05-06 DIAGNOSIS — J449 Chronic obstructive pulmonary disease, unspecified: Secondary | ICD-10-CM | POA: Diagnosis not present

## 2019-05-06 DIAGNOSIS — M25512 Pain in left shoulder: Secondary | ICD-10-CM | POA: Diagnosis not present

## 2019-05-06 DIAGNOSIS — I714 Abdominal aortic aneurysm, without rupture: Secondary | ICD-10-CM | POA: Diagnosis not present

## 2019-05-06 DIAGNOSIS — Z299 Encounter for prophylactic measures, unspecified: Secondary | ICD-10-CM | POA: Diagnosis not present

## 2019-05-06 DIAGNOSIS — Z6825 Body mass index (BMI) 25.0-25.9, adult: Secondary | ICD-10-CM | POA: Diagnosis not present

## 2019-05-06 DIAGNOSIS — R69 Illness, unspecified: Secondary | ICD-10-CM | POA: Diagnosis not present

## 2019-05-09 ENCOUNTER — Other Ambulatory Visit: Payer: Self-pay

## 2019-05-09 ENCOUNTER — Ambulatory Visit
Admission: RE | Admit: 2019-05-09 | Discharge: 2019-05-09 | Disposition: A | Discharge status: Home / Self Care | Payer: Medicare HMO | Source: Ambulatory Visit | Attending: Orthopaedic Surgery | Admitting: Orthopaedic Surgery

## 2019-05-09 DIAGNOSIS — M25512 Pain in left shoulder: Secondary | ICD-10-CM

## 2019-05-09 DIAGNOSIS — S42142A Displaced fracture of glenoid cavity of scapula, left shoulder, initial encounter for closed fracture: Secondary | ICD-10-CM | POA: Diagnosis not present

## 2019-05-23 DIAGNOSIS — I714 Abdominal aortic aneurysm, without rupture: Secondary | ICD-10-CM | POA: Diagnosis not present

## 2019-05-23 DIAGNOSIS — R69 Illness, unspecified: Secondary | ICD-10-CM | POA: Diagnosis not present

## 2019-05-23 DIAGNOSIS — Z6824 Body mass index (BMI) 24.0-24.9, adult: Secondary | ICD-10-CM | POA: Diagnosis not present

## 2019-05-23 DIAGNOSIS — M25512 Pain in left shoulder: Secondary | ICD-10-CM | POA: Diagnosis not present

## 2019-05-23 DIAGNOSIS — J449 Chronic obstructive pulmonary disease, unspecified: Secondary | ICD-10-CM | POA: Diagnosis not present

## 2019-05-23 DIAGNOSIS — Z299 Encounter for prophylactic measures, unspecified: Secondary | ICD-10-CM | POA: Diagnosis not present

## 2019-06-12 DIAGNOSIS — I10 Essential (primary) hypertension: Secondary | ICD-10-CM | POA: Diagnosis not present

## 2019-06-12 DIAGNOSIS — M545 Low back pain: Secondary | ICD-10-CM | POA: Diagnosis not present

## 2019-06-12 DIAGNOSIS — M25512 Pain in left shoulder: Secondary | ICD-10-CM | POA: Diagnosis not present

## 2019-06-12 DIAGNOSIS — G8929 Other chronic pain: Secondary | ICD-10-CM | POA: Diagnosis not present

## 2019-06-12 DIAGNOSIS — G894 Chronic pain syndrome: Secondary | ICD-10-CM | POA: Diagnosis not present

## 2019-06-17 DIAGNOSIS — M25561 Pain in right knee: Secondary | ICD-10-CM | POA: Diagnosis not present

## 2019-06-17 DIAGNOSIS — M25512 Pain in left shoulder: Secondary | ICD-10-CM | POA: Diagnosis not present

## 2019-06-17 DIAGNOSIS — M545 Low back pain: Secondary | ICD-10-CM | POA: Diagnosis not present

## 2019-06-17 DIAGNOSIS — M25562 Pain in left knee: Secondary | ICD-10-CM | POA: Diagnosis not present

## 2019-06-23 DIAGNOSIS — M25562 Pain in left knee: Secondary | ICD-10-CM | POA: Diagnosis not present

## 2019-06-23 DIAGNOSIS — M25561 Pain in right knee: Secondary | ICD-10-CM | POA: Diagnosis not present

## 2019-06-23 DIAGNOSIS — M25512 Pain in left shoulder: Secondary | ICD-10-CM | POA: Diagnosis not present

## 2019-06-23 DIAGNOSIS — M545 Low back pain: Secondary | ICD-10-CM | POA: Diagnosis not present

## 2019-07-02 DIAGNOSIS — M25512 Pain in left shoulder: Secondary | ICD-10-CM | POA: Diagnosis not present

## 2019-07-02 DIAGNOSIS — M25562 Pain in left knee: Secondary | ICD-10-CM | POA: Diagnosis not present

## 2019-07-02 DIAGNOSIS — M545 Low back pain: Secondary | ICD-10-CM | POA: Diagnosis not present

## 2019-07-02 DIAGNOSIS — M25561 Pain in right knee: Secondary | ICD-10-CM | POA: Diagnosis not present

## 2019-07-07 DIAGNOSIS — M25561 Pain in right knee: Secondary | ICD-10-CM | POA: Diagnosis not present

## 2019-07-07 DIAGNOSIS — M810 Age-related osteoporosis without current pathological fracture: Secondary | ICD-10-CM | POA: Diagnosis not present

## 2019-07-07 DIAGNOSIS — M545 Low back pain: Secondary | ICD-10-CM | POA: Diagnosis not present

## 2019-07-07 DIAGNOSIS — M25562 Pain in left knee: Secondary | ICD-10-CM | POA: Diagnosis not present

## 2019-07-07 DIAGNOSIS — M25512 Pain in left shoulder: Secondary | ICD-10-CM | POA: Diagnosis not present

## 2019-07-10 DIAGNOSIS — M25512 Pain in left shoulder: Secondary | ICD-10-CM | POA: Diagnosis not present

## 2019-07-10 DIAGNOSIS — Z6824 Body mass index (BMI) 24.0-24.9, adult: Secondary | ICD-10-CM | POA: Diagnosis not present

## 2019-07-10 DIAGNOSIS — Z299 Encounter for prophylactic measures, unspecified: Secondary | ICD-10-CM | POA: Diagnosis not present

## 2019-07-10 DIAGNOSIS — M545 Low back pain: Secondary | ICD-10-CM | POA: Diagnosis not present

## 2019-07-10 DIAGNOSIS — G8929 Other chronic pain: Secondary | ICD-10-CM | POA: Diagnosis not present

## 2019-07-10 DIAGNOSIS — I10 Essential (primary) hypertension: Secondary | ICD-10-CM | POA: Diagnosis not present

## 2019-07-10 DIAGNOSIS — J449 Chronic obstructive pulmonary disease, unspecified: Secondary | ICD-10-CM | POA: Diagnosis not present

## 2019-07-10 DIAGNOSIS — R69 Illness, unspecified: Secondary | ICD-10-CM | POA: Diagnosis not present

## 2019-07-16 DIAGNOSIS — R69 Illness, unspecified: Secondary | ICD-10-CM | POA: Diagnosis not present

## 2019-08-11 DIAGNOSIS — M545 Low back pain: Secondary | ICD-10-CM | POA: Diagnosis not present

## 2019-08-11 DIAGNOSIS — M25512 Pain in left shoulder: Secondary | ICD-10-CM | POA: Diagnosis not present

## 2019-08-11 DIAGNOSIS — G8929 Other chronic pain: Secondary | ICD-10-CM | POA: Diagnosis not present

## 2019-08-11 DIAGNOSIS — I10 Essential (primary) hypertension: Secondary | ICD-10-CM | POA: Diagnosis not present

## 2019-08-13 DIAGNOSIS — Z6825 Body mass index (BMI) 25.0-25.9, adult: Secondary | ICD-10-CM | POA: Diagnosis not present

## 2019-08-13 DIAGNOSIS — Z299 Encounter for prophylactic measures, unspecified: Secondary | ICD-10-CM | POA: Diagnosis not present

## 2019-08-13 DIAGNOSIS — Z1331 Encounter for screening for depression: Secondary | ICD-10-CM | POA: Diagnosis not present

## 2019-08-13 DIAGNOSIS — Z0000 Encounter for general adult medical examination without abnormal findings: Secondary | ICD-10-CM | POA: Diagnosis not present

## 2019-08-13 DIAGNOSIS — Z1339 Encounter for screening examination for other mental health and behavioral disorders: Secondary | ICD-10-CM | POA: Diagnosis not present

## 2019-08-13 DIAGNOSIS — I10 Essential (primary) hypertension: Secondary | ICD-10-CM | POA: Diagnosis not present

## 2019-08-13 DIAGNOSIS — Z7189 Other specified counseling: Secondary | ICD-10-CM | POA: Diagnosis not present

## 2019-08-13 DIAGNOSIS — R69 Illness, unspecified: Secondary | ICD-10-CM | POA: Diagnosis not present

## 2019-08-13 DIAGNOSIS — Z79899 Other long term (current) drug therapy: Secondary | ICD-10-CM | POA: Diagnosis not present

## 2019-08-13 DIAGNOSIS — R5383 Other fatigue: Secondary | ICD-10-CM | POA: Diagnosis not present

## 2019-08-13 DIAGNOSIS — Z1211 Encounter for screening for malignant neoplasm of colon: Secondary | ICD-10-CM | POA: Diagnosis not present

## 2019-09-16 DIAGNOSIS — M25512 Pain in left shoulder: Secondary | ICD-10-CM | POA: Diagnosis not present

## 2019-09-16 DIAGNOSIS — M545 Low back pain: Secondary | ICD-10-CM | POA: Diagnosis not present

## 2019-09-16 DIAGNOSIS — G8929 Other chronic pain: Secondary | ICD-10-CM | POA: Diagnosis not present

## 2019-09-16 DIAGNOSIS — I10 Essential (primary) hypertension: Secondary | ICD-10-CM | POA: Diagnosis not present

## 2019-09-17 DIAGNOSIS — H524 Presbyopia: Secondary | ICD-10-CM | POA: Diagnosis not present

## 2019-09-17 DIAGNOSIS — H35033 Hypertensive retinopathy, bilateral: Secondary | ICD-10-CM | POA: Diagnosis not present

## 2019-09-25 DIAGNOSIS — J449 Chronic obstructive pulmonary disease, unspecified: Secondary | ICD-10-CM | POA: Diagnosis not present

## 2019-09-25 DIAGNOSIS — Z09 Encounter for follow-up examination after completed treatment for conditions other than malignant neoplasm: Secondary | ICD-10-CM | POA: Diagnosis not present

## 2019-09-25 DIAGNOSIS — D128 Benign neoplasm of rectum: Secondary | ICD-10-CM | POA: Diagnosis not present

## 2019-10-01 DIAGNOSIS — Z299 Encounter for prophylactic measures, unspecified: Secondary | ICD-10-CM | POA: Diagnosis not present

## 2019-10-01 DIAGNOSIS — Z6825 Body mass index (BMI) 25.0-25.9, adult: Secondary | ICD-10-CM | POA: Diagnosis not present

## 2019-10-01 DIAGNOSIS — N183 Chronic kidney disease, stage 3 unspecified: Secondary | ICD-10-CM | POA: Diagnosis not present

## 2019-10-01 DIAGNOSIS — I714 Abdominal aortic aneurysm, without rupture: Secondary | ICD-10-CM | POA: Diagnosis not present

## 2019-10-01 DIAGNOSIS — I10 Essential (primary) hypertension: Secondary | ICD-10-CM | POA: Diagnosis not present

## 2019-10-01 DIAGNOSIS — J449 Chronic obstructive pulmonary disease, unspecified: Secondary | ICD-10-CM | POA: Diagnosis not present

## 2019-10-01 DIAGNOSIS — R69 Illness, unspecified: Secondary | ICD-10-CM | POA: Diagnosis not present

## 2019-10-01 DIAGNOSIS — K219 Gastro-esophageal reflux disease without esophagitis: Secondary | ICD-10-CM | POA: Diagnosis not present

## 2019-10-02 DIAGNOSIS — Z720 Tobacco use: Secondary | ICD-10-CM | POA: Diagnosis not present

## 2019-10-02 DIAGNOSIS — D128 Benign neoplasm of rectum: Secondary | ICD-10-CM | POA: Diagnosis not present

## 2019-10-21 DIAGNOSIS — L819 Disorder of pigmentation, unspecified: Secondary | ICD-10-CM | POA: Diagnosis not present

## 2019-10-21 DIAGNOSIS — L570 Actinic keratosis: Secondary | ICD-10-CM | POA: Diagnosis not present

## 2019-10-21 DIAGNOSIS — Z85828 Personal history of other malignant neoplasm of skin: Secondary | ICD-10-CM | POA: Diagnosis not present

## 2019-11-10 DIAGNOSIS — G8929 Other chronic pain: Secondary | ICD-10-CM | POA: Diagnosis not present

## 2019-11-10 DIAGNOSIS — M130 Polyarthritis, unspecified: Secondary | ICD-10-CM | POA: Diagnosis not present

## 2019-11-10 DIAGNOSIS — M25512 Pain in left shoulder: Secondary | ICD-10-CM | POA: Diagnosis not present

## 2019-11-10 DIAGNOSIS — I10 Essential (primary) hypertension: Secondary | ICD-10-CM | POA: Diagnosis not present

## 2019-11-10 DIAGNOSIS — M545 Low back pain: Secondary | ICD-10-CM | POA: Diagnosis not present

## 2019-11-19 DIAGNOSIS — I714 Abdominal aortic aneurysm, without rupture: Secondary | ICD-10-CM | POA: Diagnosis not present

## 2019-11-19 DIAGNOSIS — Z299 Encounter for prophylactic measures, unspecified: Secondary | ICD-10-CM | POA: Diagnosis not present

## 2019-11-19 DIAGNOSIS — R69 Illness, unspecified: Secondary | ICD-10-CM | POA: Diagnosis not present

## 2019-11-19 DIAGNOSIS — I10 Essential (primary) hypertension: Secondary | ICD-10-CM | POA: Diagnosis not present

## 2019-11-19 DIAGNOSIS — N183 Chronic kidney disease, stage 3 unspecified: Secondary | ICD-10-CM | POA: Diagnosis not present

## 2019-11-19 DIAGNOSIS — J449 Chronic obstructive pulmonary disease, unspecified: Secondary | ICD-10-CM | POA: Diagnosis not present

## 2019-11-24 DIAGNOSIS — D128 Benign neoplasm of rectum: Secondary | ICD-10-CM | POA: Diagnosis not present

## 2019-12-04 ENCOUNTER — Other Ambulatory Visit: Payer: Self-pay

## 2019-12-04 DIAGNOSIS — I712 Thoracic aortic aneurysm, without rupture, unspecified: Secondary | ICD-10-CM

## 2019-12-26 ENCOUNTER — Other Ambulatory Visit: Payer: Self-pay

## 2019-12-26 ENCOUNTER — Ambulatory Visit
Admission: RE | Admit: 2019-12-26 | Discharge: 2019-12-26 | Disposition: A | Discharge status: Home / Self Care | Payer: Medicare HMO | Source: Ambulatory Visit | Attending: Vascular Surgery | Admitting: Vascular Surgery

## 2019-12-26 DIAGNOSIS — I712 Thoracic aortic aneurysm, without rupture, unspecified: Secondary | ICD-10-CM

## 2019-12-26 DIAGNOSIS — I714 Abdominal aortic aneurysm, without rupture: Secondary | ICD-10-CM | POA: Diagnosis not present

## 2019-12-26 MED ORDER — IOPAMIDOL (ISOVUE-370) INJECTION 76%
75.0000 mL | Freq: Once | INTRAVENOUS | Status: AC | PRN
  Administered 2019-12-26: 75 mL via INTRAVENOUS

## 2020-01-06 ENCOUNTER — Other Ambulatory Visit: Payer: Self-pay

## 2020-01-06 ENCOUNTER — Ambulatory Visit (INDEPENDENT_AMBULATORY_CARE_PROVIDER_SITE_OTHER): Payer: Medicare HMO | Admitting: Vascular Surgery

## 2020-01-06 ENCOUNTER — Encounter: Payer: Self-pay | Admitting: *Deleted

## 2020-01-06 ENCOUNTER — Encounter: Payer: Self-pay | Admitting: Vascular Surgery

## 2020-01-06 VITALS — BP 122/70 | HR 57 | Temp 97.5°F | Resp 20 | Ht 67.0 in | Wt 166.8 lb

## 2020-01-06 DIAGNOSIS — I712 Thoracic aortic aneurysm, without rupture, unspecified: Secondary | ICD-10-CM

## 2020-01-06 NOTE — Progress Notes (Signed)
Vascular and Vein Specialist of [facility]  Patient name: [name] MRN: [id number] DOB: 07/03/1946 Sex: female  REASON FOR VISIT: Follow-up thoracic and suprarenal aortic aneurysm  HPI: [name] is a 74 y.o. female here today for follow-up.  She reports that she has had completed treatment of rectal cancer at [facility] and was told that this was a curative resection.  She has no symptoms referable to her aneurysm.  She had undergone a prior urgent repair of expanding abdominal aortic aneurysm in January 2012.  She does have known ectasia of her aorta above this level.  She is seen today with discussion of recent CT scan follow-up  Past Medical History:  Diagnosis Date  . AAA (abdominal aortic aneurysm) ([place])   . GERD (gastroesophageal reflux disease)   . Hyperlipidemia   . Hypertension   . Moderate COPD (chronic obstructive pulmonary disease) ([place])   . Osteoporosis   . Tobacco abuse   . Vertigo     History reviewed. No pertinent family history.  SOCIAL HISTORY: Social History   Tobacco Use  . Smoking status: Current Every Day Smoker    Packs/day: 1.00    Years: 50.00    Pack years: 50.00    Types: Cigarettes  . Smokeless tobacco: Never Used  Substance Use Topics  . Alcohol use: Not on file    Allergies  Allergen Reactions  . Tramadol Itching  . Codeine Nausea And Vomiting    Current Outpatient Medications  Medication Sig Dispense Refill  . albuterol (PROVENTIL HFA;VENTOLIN HFA) 108 (90 BASE) MCG/ACT inhaler Inhale 2 puffs into the lungs every 6 (six) hours as needed for wheezing or shortness of breath.    . baclofen (LIORESAL) 10 MG tablet Take 10 mg by mouth 3 (three) times daily.    Marland Kitchen BREO ELLIPTA 100-25 MCG/INH AEPB 1 puff daily.    . Calcium Citrate-Vitamin D (CALCIUM + D PO) Take by mouth.    . cyanocobalamin 1000 MCG tablet Take by mouth.    . denosumab (PROLIA) 60 MG/ML SOSY injection Inject  into the skin.    . DULoxetine (CYMBALTA) 30 MG capsule duloxetine 30 mg capsule,delayed release  TAKE 1 CAPSULE BY MOUTH ONCE DAILY FOR 2 WEEKS THEN INCREASE TO 2 CAPSULES DAILY AS TOLERATED    . Fluticasone-Salmeterol (ADVAIR) 100-50 MCG/DOSE AEPB Inhale 1 puff into the lungs as needed.     . gabapentin (NEURONTIN) 100 MG capsule gabapentin 100 mg capsule  TAKE 1 TO 3 CAPSULES BY MOUTH AT BEDTIME AS TOLERATED FOR 2 WEEKS THEN PICK UP NEW RX    . HYDROcodone-acetaminophen (NORCO/VICODIN) 5-325 MG tablet TAKE 1 TO 2 TABLETS BY MOUTH ONCE DAILY AS NEEDED    . losartan (COZAAR) 50 MG tablet Take by mouth.    . metoprolol succinate (TOPROL-XL) 50 MG 24 hr tablet Take 50 mg by mouth daily. Take with or immediately following a meal.     . omeprazole (PRILOSEC) 20 MG capsule Take by mouth.    . pantoprazole (PROTONIX) 40 MG tablet pantoprazole 40 mg tablet,delayed release  TAKE 1 TABLET BY MOUTH TWICE DAILY FOR 14 DAYS THEN REDUCE TO 1 TABLET DAILY    . simvastatin (ZOCOR) 20 MG tablet Take 20 mg by mouth daily.    Marland Kitchen tiZANidine (ZANAFLEX) 4 MG tablet Take 4 mg by mouth every 6 (six) hours as needed for muscle spasms. Reported on 09/14/2015    . valsartan (DIOVAN) 160 MG tablet Take 160  mg by mouth daily.     No current facility-administered medications for this visit.    REVIEW OF SYSTEMS:  [X]  denotes positive finding, [ ]  denotes negative finding Cardiac  Comments:  Chest pain or chest pressure:    Shortness of breath upon exertion:    Short of breath when lying flat:    Irregular heart rhythm:        Vascular    Pain in calf, thigh, or hip brought on by ambulation:    Pain in feet at night that wakes you up from your sleep:     Blood clot in your veins:    Leg swelling:           PHYSICAL EXAM: Vitals:   01/06/20 1022  BP: 122/70  Pulse: (!) 57  Resp: 20  Temp: (!) 97.5 F (36.4 C)  SpO2: 97%  Weight: 166 lb 12.8 oz (75.7 kg)  Height: 5\' 7"  (1.702 m)    GENERAL: The patient  is a well-nourished female, in no acute distress. The vital signs are documented above. CARDIOVASCULAR: Abdomen soft and nontender with no palpable aneurysm.  2+ femoral pulses bilaterally PULMONARY: There is good air exchange  MUSCULOSKELETAL: There are no major deformities or cyanosis. NEUROLOGIC: No focal weakness or paresthesias are detected. SKIN: There are no ulcers or rashes noted. PSYCHIATRIC: The patient has a normal affect.  DATA:  CT scan was reviewed with actual images reviewed.  I compared these to her CT scan of her chest in 2020.  She does have a very ectatic aorta above her mesenteric vessels and there is some oval elongation in several areas.  The maximal of these is approximately 5.6 cm in oblique measurement.  When comparing this with her chest CT this is no change.  MEDICAL ISSUES: Long discussion with the patient.  I did explain that she does have some risk for rupture related to her aneurysm and the ectatic changes of her thoracic aorta.  This has not significantly changed.  I feel that her risk for intervention far outweighs her risk for rupture and recommended that we see her again in 1 year with repeat CT scan.  She knows to call [phone] 1 report immediately to the emergency room should she have any chest or abdominal pain    Rosetta Posner, MD [facility] Vascular and Vein Specialists of [facility] Tel [phone] Pager [phone]

## 2020-01-08 DIAGNOSIS — M810 Age-related osteoporosis without current pathological fracture: Secondary | ICD-10-CM | POA: Diagnosis not present

## 2020-01-12 DIAGNOSIS — M545 Low back pain: Secondary | ICD-10-CM | POA: Diagnosis not present

## 2020-01-12 DIAGNOSIS — M25512 Pain in left shoulder: Secondary | ICD-10-CM | POA: Diagnosis not present

## 2020-01-12 DIAGNOSIS — M130 Polyarthritis, unspecified: Secondary | ICD-10-CM | POA: Diagnosis not present

## 2020-01-12 DIAGNOSIS — I10 Essential (primary) hypertension: Secondary | ICD-10-CM | POA: Diagnosis not present

## 2020-01-13 DIAGNOSIS — N183 Chronic kidney disease, stage 3 unspecified: Secondary | ICD-10-CM | POA: Diagnosis not present

## 2020-01-13 DIAGNOSIS — I10 Essential (primary) hypertension: Secondary | ICD-10-CM | POA: Diagnosis not present

## 2020-01-13 DIAGNOSIS — Z299 Encounter for prophylactic measures, unspecified: Secondary | ICD-10-CM | POA: Diagnosis not present

## 2020-01-13 DIAGNOSIS — I714 Abdominal aortic aneurysm, without rupture: Secondary | ICD-10-CM | POA: Diagnosis not present

## 2020-01-13 DIAGNOSIS — M1710 Unilateral primary osteoarthritis, unspecified knee: Secondary | ICD-10-CM | POA: Diagnosis not present

## 2020-01-13 DIAGNOSIS — J449 Chronic obstructive pulmonary disease, unspecified: Secondary | ICD-10-CM | POA: Diagnosis not present

## 2020-01-13 DIAGNOSIS — R69 Illness, unspecified: Secondary | ICD-10-CM | POA: Diagnosis not present

## 2020-01-20 DIAGNOSIS — M25562 Pain in left knee: Secondary | ICD-10-CM | POA: Diagnosis not present

## 2020-01-20 DIAGNOSIS — M25561 Pain in right knee: Secondary | ICD-10-CM | POA: Diagnosis not present

## 2020-02-24 DIAGNOSIS — Z299 Encounter for prophylactic measures, unspecified: Secondary | ICD-10-CM | POA: Diagnosis not present

## 2020-02-24 DIAGNOSIS — J449 Chronic obstructive pulmonary disease, unspecified: Secondary | ICD-10-CM | POA: Diagnosis not present

## 2020-02-24 DIAGNOSIS — N183 Chronic kidney disease, stage 3 unspecified: Secondary | ICD-10-CM | POA: Diagnosis not present

## 2020-02-24 DIAGNOSIS — I10 Essential (primary) hypertension: Secondary | ICD-10-CM | POA: Diagnosis not present

## 2020-02-24 DIAGNOSIS — R69 Illness, unspecified: Secondary | ICD-10-CM | POA: Diagnosis not present

## 2020-03-02 DIAGNOSIS — K219 Gastro-esophageal reflux disease without esophagitis: Secondary | ICD-10-CM | POA: Diagnosis not present

## 2020-03-08 DIAGNOSIS — M130 Polyarthritis, unspecified: Secondary | ICD-10-CM | POA: Diagnosis not present

## 2020-03-08 DIAGNOSIS — M545 Low back pain: Secondary | ICD-10-CM | POA: Diagnosis not present

## 2020-03-08 DIAGNOSIS — Z79891 Long term (current) use of opiate analgesic: Secondary | ICD-10-CM | POA: Diagnosis not present

## 2020-03-08 DIAGNOSIS — I10 Essential (primary) hypertension: Secondary | ICD-10-CM | POA: Diagnosis not present

## 2020-03-08 DIAGNOSIS — M25512 Pain in left shoulder: Secondary | ICD-10-CM | POA: Diagnosis not present

## 2020-03-11 DIAGNOSIS — K219 Gastro-esophageal reflux disease without esophagitis: Secondary | ICD-10-CM | POA: Diagnosis not present

## 2020-03-11 DIAGNOSIS — R110 Nausea: Secondary | ICD-10-CM | POA: Diagnosis not present

## 2020-03-11 DIAGNOSIS — N183 Chronic kidney disease, stage 3 unspecified: Secondary | ICD-10-CM | POA: Diagnosis not present

## 2020-03-11 DIAGNOSIS — R69 Illness, unspecified: Secondary | ICD-10-CM | POA: Diagnosis not present

## 2020-03-11 DIAGNOSIS — Z299 Encounter for prophylactic measures, unspecified: Secondary | ICD-10-CM | POA: Diagnosis not present

## 2020-03-11 DIAGNOSIS — J449 Chronic obstructive pulmonary disease, unspecified: Secondary | ICD-10-CM | POA: Diagnosis not present

## 2020-03-11 DIAGNOSIS — I10 Essential (primary) hypertension: Secondary | ICD-10-CM | POA: Diagnosis not present

## 2020-03-11 DIAGNOSIS — I714 Abdominal aortic aneurysm, without rupture: Secondary | ICD-10-CM | POA: Diagnosis not present

## 2020-03-18 DIAGNOSIS — J449 Chronic obstructive pulmonary disease, unspecified: Secondary | ICD-10-CM | POA: Diagnosis not present

## 2020-03-18 DIAGNOSIS — K219 Gastro-esophageal reflux disease without esophagitis: Secondary | ICD-10-CM | POA: Diagnosis not present

## 2020-03-18 DIAGNOSIS — I10 Essential (primary) hypertension: Secondary | ICD-10-CM | POA: Diagnosis not present

## 2020-03-18 DIAGNOSIS — I714 Abdominal aortic aneurysm, without rupture: Secondary | ICD-10-CM | POA: Diagnosis not present

## 2020-03-18 DIAGNOSIS — Z299 Encounter for prophylactic measures, unspecified: Secondary | ICD-10-CM | POA: Diagnosis not present

## 2020-03-18 DIAGNOSIS — N183 Chronic kidney disease, stage 3 unspecified: Secondary | ICD-10-CM | POA: Diagnosis not present

## 2020-03-18 DIAGNOSIS — R69 Illness, unspecified: Secondary | ICD-10-CM | POA: Diagnosis not present

## 2020-03-24 DIAGNOSIS — C20 Malignant neoplasm of rectum: Secondary | ICD-10-CM | POA: Diagnosis not present

## 2020-03-24 DIAGNOSIS — D649 Anemia, unspecified: Secondary | ICD-10-CM | POA: Diagnosis not present

## 2020-03-24 DIAGNOSIS — J449 Chronic obstructive pulmonary disease, unspecified: Secondary | ICD-10-CM | POA: Diagnosis not present

## 2020-03-24 DIAGNOSIS — D128 Benign neoplasm of rectum: Secondary | ICD-10-CM | POA: Diagnosis not present

## 2020-03-31 DIAGNOSIS — Z08 Encounter for follow-up examination after completed treatment for malignant neoplasm: Secondary | ICD-10-CM | POA: Diagnosis not present

## 2020-03-31 DIAGNOSIS — Z1231 Encounter for screening mammogram for malignant neoplasm of breast: Secondary | ICD-10-CM | POA: Diagnosis not present

## 2020-03-31 DIAGNOSIS — E875 Hyperkalemia: Secondary | ICD-10-CM | POA: Diagnosis not present

## 2020-03-31 DIAGNOSIS — Z85048 Personal history of other malignant neoplasm of rectum, rectosigmoid junction, and anus: Secondary | ICD-10-CM | POA: Diagnosis not present

## 2020-03-31 DIAGNOSIS — I10 Essential (primary) hypertension: Secondary | ICD-10-CM | POA: Diagnosis not present

## 2020-03-31 DIAGNOSIS — Z299 Encounter for prophylactic measures, unspecified: Secondary | ICD-10-CM | POA: Diagnosis not present

## 2020-03-31 DIAGNOSIS — C20 Malignant neoplasm of rectum: Secondary | ICD-10-CM | POA: Diagnosis not present

## 2020-03-31 DIAGNOSIS — N183 Chronic kidney disease, stage 3 unspecified: Secondary | ICD-10-CM | POA: Diagnosis not present

## 2020-03-31 DIAGNOSIS — J449 Chronic obstructive pulmonary disease, unspecified: Secondary | ICD-10-CM | POA: Diagnosis not present

## 2020-04-05 DIAGNOSIS — I10 Essential (primary) hypertension: Secondary | ICD-10-CM | POA: Diagnosis not present

## 2020-04-05 DIAGNOSIS — Z299 Encounter for prophylactic measures, unspecified: Secondary | ICD-10-CM | POA: Diagnosis not present

## 2020-04-05 DIAGNOSIS — J449 Chronic obstructive pulmonary disease, unspecified: Secondary | ICD-10-CM | POA: Diagnosis not present

## 2020-04-05 DIAGNOSIS — K5790 Diverticulosis of intestine, part unspecified, without perforation or abscess without bleeding: Secondary | ICD-10-CM | POA: Diagnosis not present

## 2020-04-05 DIAGNOSIS — N183 Chronic kidney disease, stage 3 unspecified: Secondary | ICD-10-CM | POA: Diagnosis not present

## 2020-04-05 DIAGNOSIS — R21 Rash and other nonspecific skin eruption: Secondary | ICD-10-CM | POA: Diagnosis not present

## 2020-04-13 DIAGNOSIS — E875 Hyperkalemia: Secondary | ICD-10-CM | POA: Diagnosis not present

## 2020-04-15 DIAGNOSIS — D120 Benign neoplasm of cecum: Secondary | ICD-10-CM | POA: Diagnosis not present

## 2020-04-15 DIAGNOSIS — Z885 Allergy status to narcotic agent status: Secondary | ICD-10-CM | POA: Diagnosis not present

## 2020-04-15 DIAGNOSIS — K635 Polyp of colon: Secondary | ICD-10-CM | POA: Diagnosis not present

## 2020-04-15 DIAGNOSIS — I10 Essential (primary) hypertension: Secondary | ICD-10-CM | POA: Diagnosis not present

## 2020-04-15 DIAGNOSIS — D128 Benign neoplasm of rectum: Secondary | ICD-10-CM | POA: Diagnosis not present

## 2020-04-15 DIAGNOSIS — Z79899 Other long term (current) drug therapy: Secondary | ICD-10-CM | POA: Diagnosis not present

## 2020-04-15 DIAGNOSIS — J449 Chronic obstructive pulmonary disease, unspecified: Secondary | ICD-10-CM | POA: Diagnosis not present

## 2020-04-15 DIAGNOSIS — Z85048 Personal history of other malignant neoplasm of rectum, rectosigmoid junction, and anus: Secondary | ICD-10-CM | POA: Diagnosis not present

## 2020-04-15 DIAGNOSIS — Z87891 Personal history of nicotine dependence: Secondary | ICD-10-CM | POA: Diagnosis not present

## 2020-04-15 DIAGNOSIS — K621 Rectal polyp: Secondary | ICD-10-CM | POA: Diagnosis not present

## 2020-04-15 DIAGNOSIS — Z08 Encounter for follow-up examination after completed treatment for malignant neoplasm: Secondary | ICD-10-CM | POA: Diagnosis not present

## 2020-04-15 DIAGNOSIS — Z9049 Acquired absence of other specified parts of digestive tract: Secondary | ICD-10-CM | POA: Diagnosis not present

## 2020-04-15 DIAGNOSIS — D126 Benign neoplasm of colon, unspecified: Secondary | ICD-10-CM | POA: Diagnosis not present

## 2020-04-19 DIAGNOSIS — Z85828 Personal history of other malignant neoplasm of skin: Secondary | ICD-10-CM | POA: Diagnosis not present

## 2020-04-19 DIAGNOSIS — L570 Actinic keratosis: Secondary | ICD-10-CM | POA: Diagnosis not present

## 2020-05-03 DIAGNOSIS — M130 Polyarthritis, unspecified: Secondary | ICD-10-CM | POA: Diagnosis not present

## 2020-05-03 DIAGNOSIS — M545 Low back pain: Secondary | ICD-10-CM | POA: Diagnosis not present

## 2020-05-03 DIAGNOSIS — I10 Essential (primary) hypertension: Secondary | ICD-10-CM | POA: Diagnosis not present

## 2020-05-03 DIAGNOSIS — M25512 Pain in left shoulder: Secondary | ICD-10-CM | POA: Diagnosis not present

## 2020-05-03 DIAGNOSIS — Z79891 Long term (current) use of opiate analgesic: Secondary | ICD-10-CM | POA: Diagnosis not present

## 2020-05-19 DIAGNOSIS — Z08 Encounter for follow-up examination after completed treatment for malignant neoplasm: Secondary | ICD-10-CM | POA: Diagnosis not present

## 2020-05-19 DIAGNOSIS — Z85048 Personal history of other malignant neoplasm of rectum, rectosigmoid junction, and anus: Secondary | ICD-10-CM | POA: Diagnosis not present

## 2020-05-31 DIAGNOSIS — C189 Malignant neoplasm of colon, unspecified: Secondary | ICD-10-CM | POA: Diagnosis not present

## 2020-06-02 DIAGNOSIS — R69 Illness, unspecified: Secondary | ICD-10-CM | POA: Diagnosis not present

## 2020-06-02 DIAGNOSIS — I714 Abdominal aortic aneurysm, without rupture: Secondary | ICD-10-CM | POA: Diagnosis not present

## 2020-06-02 DIAGNOSIS — N183 Chronic kidney disease, stage 3 unspecified: Secondary | ICD-10-CM | POA: Diagnosis not present

## 2020-06-02 DIAGNOSIS — I10 Essential (primary) hypertension: Secondary | ICD-10-CM | POA: Diagnosis not present

## 2020-06-02 DIAGNOSIS — Z299 Encounter for prophylactic measures, unspecified: Secondary | ICD-10-CM | POA: Diagnosis not present

## 2020-06-02 DIAGNOSIS — J449 Chronic obstructive pulmonary disease, unspecified: Secondary | ICD-10-CM | POA: Diagnosis not present

## 2020-06-24 DIAGNOSIS — J209 Acute bronchitis, unspecified: Secondary | ICD-10-CM | POA: Diagnosis not present

## 2020-06-24 DIAGNOSIS — J440 Chronic obstructive pulmonary disease with acute lower respiratory infection: Secondary | ICD-10-CM | POA: Diagnosis not present

## 2020-06-24 DIAGNOSIS — Z299 Encounter for prophylactic measures, unspecified: Secondary | ICD-10-CM | POA: Diagnosis not present

## 2020-06-28 DIAGNOSIS — M130 Polyarthritis, unspecified: Secondary | ICD-10-CM | POA: Diagnosis not present

## 2020-06-28 DIAGNOSIS — I10 Essential (primary) hypertension: Secondary | ICD-10-CM | POA: Diagnosis not present

## 2020-06-28 DIAGNOSIS — Z79891 Long term (current) use of opiate analgesic: Secondary | ICD-10-CM | POA: Diagnosis not present

## 2020-06-28 DIAGNOSIS — M545 Low back pain, unspecified: Secondary | ICD-10-CM | POA: Diagnosis not present

## 2020-06-28 DIAGNOSIS — M25512 Pain in left shoulder: Secondary | ICD-10-CM | POA: Diagnosis not present

## 2020-07-13 DIAGNOSIS — S61412A Laceration without foreign body of left hand, initial encounter: Secondary | ICD-10-CM | POA: Diagnosis not present

## 2020-07-13 DIAGNOSIS — I714 Abdominal aortic aneurysm, without rupture: Secondary | ICD-10-CM | POA: Diagnosis not present

## 2020-07-13 DIAGNOSIS — J449 Chronic obstructive pulmonary disease, unspecified: Secondary | ICD-10-CM | POA: Diagnosis not present

## 2020-07-13 DIAGNOSIS — Z299 Encounter for prophylactic measures, unspecified: Secondary | ICD-10-CM | POA: Diagnosis not present

## 2020-07-13 DIAGNOSIS — R69 Illness, unspecified: Secondary | ICD-10-CM | POA: Diagnosis not present

## 2020-07-13 DIAGNOSIS — N183 Chronic kidney disease, stage 3 unspecified: Secondary | ICD-10-CM | POA: Diagnosis not present

## 2020-07-13 DIAGNOSIS — Z23 Encounter for immunization: Secondary | ICD-10-CM | POA: Diagnosis not present

## 2020-07-20 DIAGNOSIS — I10 Essential (primary) hypertension: Secondary | ICD-10-CM | POA: Diagnosis not present

## 2020-07-20 DIAGNOSIS — J449 Chronic obstructive pulmonary disease, unspecified: Secondary | ICD-10-CM | POA: Diagnosis not present

## 2020-07-20 DIAGNOSIS — I714 Abdominal aortic aneurysm, without rupture: Secondary | ICD-10-CM | POA: Diagnosis not present

## 2020-07-20 DIAGNOSIS — N183 Chronic kidney disease, stage 3 unspecified: Secondary | ICD-10-CM | POA: Diagnosis not present

## 2020-07-20 DIAGNOSIS — Z299 Encounter for prophylactic measures, unspecified: Secondary | ICD-10-CM | POA: Diagnosis not present

## 2020-07-20 DIAGNOSIS — R69 Illness, unspecified: Secondary | ICD-10-CM | POA: Diagnosis not present

## 2020-07-20 DIAGNOSIS — S61419A Laceration without foreign body of unspecified hand, initial encounter: Secondary | ICD-10-CM | POA: Diagnosis not present

## 2020-08-09 DIAGNOSIS — C44319 Basal cell carcinoma of skin of other parts of face: Secondary | ICD-10-CM | POA: Diagnosis not present

## 2020-08-10 DIAGNOSIS — C44319 Basal cell carcinoma of skin of other parts of face: Secondary | ICD-10-CM | POA: Diagnosis not present

## 2020-08-18 DIAGNOSIS — I10 Essential (primary) hypertension: Secondary | ICD-10-CM | POA: Diagnosis not present

## 2020-08-18 DIAGNOSIS — R69 Illness, unspecified: Secondary | ICD-10-CM | POA: Diagnosis not present

## 2020-08-18 DIAGNOSIS — N183 Chronic kidney disease, stage 3 unspecified: Secondary | ICD-10-CM | POA: Diagnosis not present

## 2020-08-18 DIAGNOSIS — R5383 Other fatigue: Secondary | ICD-10-CM | POA: Diagnosis not present

## 2020-08-18 DIAGNOSIS — Z299 Encounter for prophylactic measures, unspecified: Secondary | ICD-10-CM | POA: Diagnosis not present

## 2020-08-18 DIAGNOSIS — Z7189 Other specified counseling: Secondary | ICD-10-CM | POA: Diagnosis not present

## 2020-08-18 DIAGNOSIS — Z1339 Encounter for screening examination for other mental health and behavioral disorders: Secondary | ICD-10-CM | POA: Diagnosis not present

## 2020-08-18 DIAGNOSIS — E7800 Pure hypercholesterolemia, unspecified: Secondary | ICD-10-CM | POA: Diagnosis not present

## 2020-08-18 DIAGNOSIS — Z6824 Body mass index (BMI) 24.0-24.9, adult: Secondary | ICD-10-CM | POA: Diagnosis not present

## 2020-08-18 DIAGNOSIS — Z1331 Encounter for screening for depression: Secondary | ICD-10-CM | POA: Diagnosis not present

## 2020-08-18 DIAGNOSIS — Z0000 Encounter for general adult medical examination without abnormal findings: Secondary | ICD-10-CM | POA: Diagnosis not present

## 2020-09-09 DIAGNOSIS — J449 Chronic obstructive pulmonary disease, unspecified: Secondary | ICD-10-CM | POA: Diagnosis not present

## 2020-09-09 DIAGNOSIS — I10 Essential (primary) hypertension: Secondary | ICD-10-CM | POA: Diagnosis not present

## 2020-09-09 DIAGNOSIS — E7800 Pure hypercholesterolemia, unspecified: Secondary | ICD-10-CM | POA: Diagnosis not present

## 2020-09-16 ENCOUNTER — Telehealth: Payer: Self-pay

## 2020-09-16 NOTE — Telephone Encounter (Signed)
Patient called c/o chest pain. She is s/p open AAA repair and has hx of thoracic aneurysm. Says the pain is localized on the right side of her chest and has been occurring for the last few days. It only happens for a few seconds at a time and occurs once or twice per day. Describes quality as "not too sharp but sort of sore." Discussed with Dr. [name] - scheduling patient for a CT and Tower f/u visit.

## 2020-09-17 ENCOUNTER — Other Ambulatory Visit: Payer: Self-pay

## 2020-09-17 DIAGNOSIS — R0789 Other chest pain: Secondary | ICD-10-CM

## 2020-10-11 ENCOUNTER — Ambulatory Visit (HOSPITAL_COMMUNITY)
Admission: RE | Admit: 2020-10-11 | Discharge: 2020-10-11 | Disposition: A | Discharge status: Home / Self Care | Payer: Medicare Other | Source: Ambulatory Visit | Attending: Vascular Surgery | Admitting: Vascular Surgery

## 2020-10-11 ENCOUNTER — Other Ambulatory Visit: Payer: Self-pay

## 2020-10-11 DIAGNOSIS — R0789 Other chest pain: Secondary | ICD-10-CM | POA: Insufficient documentation

## 2020-10-11 LAB — POCT I-STAT CREATININE: Creatinine, Ser: 1.3 mg/dL — ABNORMAL HIGH (ref 0.44–1.00)

## 2020-10-11 MED ORDER — IOHEXOL 300 MG/ML  SOLN: 100.0000 mL | Freq: Once | INTRAMUSCULAR | Status: DC | PRN

## 2020-10-11 MED ORDER — IOHEXOL 350 MG/ML SOLN
100.0000 mL | Freq: Once | INTRAVENOUS | Status: AC | PRN
  Administered 2020-10-11: 80 mL via INTRAVENOUS

## 2020-10-18 ENCOUNTER — Ambulatory Visit (INDEPENDENT_AMBULATORY_CARE_PROVIDER_SITE_OTHER): Payer: Medicare Other | Admitting: Vascular Surgery

## 2020-10-18 ENCOUNTER — Encounter: Payer: Self-pay | Admitting: Vascular Surgery

## 2020-10-18 ENCOUNTER — Other Ambulatory Visit: Payer: Self-pay

## 2020-10-18 VITALS — BP 118/75 | HR 65 | Temp 97.0°F | Ht 67.0 in | Wt 162.6 lb

## 2020-10-18 DIAGNOSIS — I712 Thoracic aortic aneurysm, without rupture, unspecified: Secondary | ICD-10-CM

## 2020-10-18 NOTE — Progress Notes (Signed)
[facility] [place]  Patient name: [name] MRN: [id number] DOB: 1946-08-24 Sex: female  REASON FOR VISIT: Follow-up thoracic aneurysm  HPI: [name] is a 75 y.o. female here today for follow-up.  Patient is status post urgent open aneurysm repair by myself in 2012.  She has known thoracic aortic aneurysm and is being seen for serial follow-up with CT scan.  She has had no new major medical difficulty.  She did have an episode of right subcostal chest discomfort and this has resolved.  I explained that the be extremely unlikely that this was related to her thoracic aneurysm.  Past Medical History:  Diagnosis Date  . AAA (abdominal aortic aneurysm) ([place])   . GERD (gastroesophageal reflux disease)   . Hyperlipidemia   . Hypertension   . Moderate COPD (chronic obstructive pulmonary disease) ([place])   . Osteoporosis   . Tobacco abuse   . Vertigo     History reviewed. No pertinent family history.  SOCIAL HISTORY: Social History   Tobacco Use  . Smoking status: Current Every Day Smoker    Packs/day: 1.00    Years: 50.00    Pack years: 50.00    Types: Cigarettes  . Smokeless tobacco: Never Used  Substance Use Topics  . Alcohol use: Not on file    Allergies  Allergen Reactions  . Tramadol Itching  . Codeine Nausea And Vomiting    Current Outpatient Medications  Medication Sig Dispense Refill  . albuterol (PROVENTIL HFA;VENTOLIN HFA) 108 (90 BASE) MCG/ACT inhaler Inhale 2 puffs into the lungs every 6 (six) hours as needed for wheezing or shortness of breath.    Marland Kitchen BREO ELLIPTA 100-25 MCG/INH AEPB 1 puff daily.    . Calcium Citrate-Vitamin D (CALCIUM + D PO) Take by mouth.    . cyanocobalamin 1000 MCG tablet Take by mouth.    . denosumab (PROLIA) 60 MG/ML SOSY injection Inject into the skin.    . DULoxetine (CYMBALTA) 30 MG capsule duloxetine 30 mg capsule,delayed release  TAKE 1 CAPSULE BY MOUTH ONCE DAILY  FOR 2 WEEKS THEN INCREASE TO 2 CAPSULES DAILY AS TOLERATED    . gabapentin (NEURONTIN) 100 MG capsule gabapentin 100 mg capsule  TAKE 1 TO 3 CAPSULES BY MOUTH AT BEDTIME AS TOLERATED FOR 2 WEEKS THEN PICK UP NEW RX    . HYDROcodone-acetaminophen (NORCO/VICODIN) 5-325 MG tablet TAKE 1 TO 2 TABLETS BY MOUTH ONCE DAILY AS NEEDED    . losartan (COZAAR) 50 MG tablet Take by mouth.    . metoprolol succinate (TOPROL-XL) 50 MG 24 hr tablet Take 50 mg by mouth daily. Take with or immediately following a meal.    . omeprazole (PRILOSEC) 20 MG capsule Take by mouth.    . simvastatin (ZOCOR) 20 MG tablet Take 20 mg by mouth daily.    Marland Kitchen tiZANidine (ZANAFLEX) 4 MG tablet Take 4 mg by mouth every 6 (six) hours as needed for muscle spasms. Reported on 09/14/2015    . valsartan (DIOVAN) 160 MG tablet Take 160 mg by mouth daily.    . baclofen (LIORESAL) 10 MG tablet Take 10 mg by mouth 3 (three) times daily. (Patient not taking: Reported on 10/18/2020)    . Fluticasone-Salmeterol (ADVAIR) 100-50 MCG/DOSE AEPB Inhale 1 puff into the lungs as needed.  (Patient not taking: Reported on 10/18/2020)    . pantoprazole (PROTONIX) 40 MG tablet pantoprazole 40 mg tablet,delayed release  TAKE 1 TABLET BY MOUTH TWICE DAILY FOR 14  DAYS THEN REDUCE TO 1 TABLET DAILY (Patient not taking: Reported on 10/18/2020)     No current facility-administered medications for this visit.    REVIEW OF SYSTEMS:  [X]  denotes positive finding, [ ]  denotes negative finding Cardiac  Comments:  Chest pain or chest pressure:    Shortness of breath upon exertion:    Short of breath when lying flat:    Irregular heart rhythm:        Vascular    Pain in calf, thigh, or hip brought on by ambulation:    Pain in feet at night that wakes you up from your sleep:     Blood clot in your veins:    Leg swelling:           PHYSICAL EXAM: Vitals:   10/18/20 1058  BP: 118/75  Pulse: 65  Temp: (!) 97 F (36.1 C)  TempSrc: Skin  SpO2: 97%  Weight: 162  lb 9.6 oz (73.8 kg)  Height: 5\' 7"  (1.702 m)    GENERAL: The patient is a well-nourished female, in no acute distress. The vital signs are documented above. CARDIOVASCULAR: 2+ radial and 2+ dorsalis pedis pulses bilaterally.  Abdomen soft with well-healed midline incision.  Carotid arteries without bruits bilaterally. PULMONARY: There is good air exchange  MUSCULOSKELETAL: There are no major deformities or cyanosis. NEUROLOGIC: No focal weakness or paresthesias are detected. SKIN: There are no ulcers or rashes noted. PSYCHIATRIC: The patient has a normal affect.  DATA:  CT scan from 10/11/2020 was reviewed with the patient.  This shows no change in her study from 1 year prior.  She does have maximal diameter of descending thoracic aorta of 5.1 cm above the diaphragm.  She does have several thrombosed nitrating ulcers which are also unchanged.  She has normal expected imaging of her prior open aneurysm repair.  MEDICAL ISSUES: Stable descending thoracic aortic aneurysm.  Will be seen again in 1 year with repeat imaging by CT scan    Rosetta Posner, MD [facility] Tel [phone]

## 2020-12-13 ENCOUNTER — Ambulatory Visit: Payer: Medicare HMO | Admitting: Vascular Surgery

## 2020-12-28 ENCOUNTER — Ambulatory Visit: Payer: Medicare HMO | Admitting: Vascular Surgery

## 2021-02-27 ENCOUNTER — Emergency Department (HOSPITAL_COMMUNITY)
Admission: EM | Admit: 2021-02-27 | Discharge: 2021-02-27 | Disposition: A | Discharge status: Home / Self Care | Payer: Medicare Other | Attending: Emergency Medicine | Admitting: Emergency Medicine

## 2021-02-27 ENCOUNTER — Encounter (HOSPITAL_COMMUNITY): Payer: Self-pay | Admitting: *Deleted

## 2021-02-27 ENCOUNTER — Emergency Department (HOSPITAL_COMMUNITY): Payer: Medicare Other

## 2021-02-27 DIAGNOSIS — R1084 Generalized abdominal pain: Secondary | ICD-10-CM | POA: Diagnosis not present

## 2021-02-27 DIAGNOSIS — Z7951 Long term (current) use of inhaled steroids: Secondary | ICD-10-CM | POA: Diagnosis not present

## 2021-02-27 DIAGNOSIS — F17210 Nicotine dependence, cigarettes, uncomplicated: Secondary | ICD-10-CM | POA: Diagnosis not present

## 2021-02-27 DIAGNOSIS — K219 Gastro-esophageal reflux disease without esophagitis: Secondary | ICD-10-CM | POA: Diagnosis not present

## 2021-02-27 DIAGNOSIS — Z79899 Other long term (current) drug therapy: Secondary | ICD-10-CM | POA: Diagnosis not present

## 2021-02-27 DIAGNOSIS — R140 Abdominal distension (gaseous): Secondary | ICD-10-CM | POA: Diagnosis not present

## 2021-02-27 DIAGNOSIS — I10 Essential (primary) hypertension: Secondary | ICD-10-CM | POA: Insufficient documentation

## 2021-02-27 DIAGNOSIS — J449 Chronic obstructive pulmonary disease, unspecified: Secondary | ICD-10-CM | POA: Insufficient documentation

## 2021-02-27 DIAGNOSIS — K5900 Constipation, unspecified: Secondary | ICD-10-CM | POA: Diagnosis not present

## 2021-02-27 LAB — COMPREHENSIVE METABOLIC PANEL
ALT: 12 U/L (ref 0–44)
AST: 14 U/L — ABNORMAL LOW (ref 15–41)
Albumin: 3.7 g/dL (ref 3.5–5.0)
Alkaline Phosphatase: 94 U/L (ref 38–126)
Anion gap: 9 (ref 5–15)
BUN: 11 mg/dL (ref 8–23)
CO2: 21 mmol/L — ABNORMAL LOW (ref 22–32)
Calcium: 9 mg/dL (ref 8.9–10.3)
Chloride: 101 mmol/L (ref 98–111)
Creatinine, Ser: 1.06 mg/dL — ABNORMAL HIGH (ref 0.44–1.00)
GFR, Estimated: 54 mL/min — ABNORMAL LOW (ref >60)
Glucose, Bld: 105 mg/dL — ABNORMAL HIGH (ref 70–99)
Potassium: 4.1 mmol/L (ref 3.5–5.1)
Sodium: 131 mmol/L — ABNORMAL LOW (ref 135–145)
Total Bilirubin: 1 mg/dL (ref 0.3–1.2)
Total Protein: 6.8 g/dL (ref 6.5–8.1)

## 2021-02-27 LAB — URINALYSIS, ROUTINE W REFLEX MICROSCOPIC
Bacteria, UA: NONE SEEN
Bilirubin Urine: NEGATIVE
Glucose, UA: NEGATIVE mg/dL
Ketones, ur: 20 mg/dL — AB
Leukocytes,Ua: NEGATIVE
Nitrite: NEGATIVE
Protein, ur: 100 mg/dL — AB
Specific Gravity, Urine: 1.009 (ref 1.005–1.030)
pH: 6 (ref 5.0–8.0)

## 2021-02-27 LAB — CBC WITH DIFFERENTIAL/PLATELET
Abs Immature Granulocytes: 0.03 10*3/uL (ref 0.00–0.07)
Basophils Absolute: 0 10*3/uL (ref 0.0–0.1)
Basophils Relative: 0 %
Eosinophils Absolute: 0.1 10*3/uL (ref 0.0–0.5)
Eosinophils Relative: 1 %
HCT: 38.6 % (ref 36.0–46.0)
Hemoglobin: 13.1 g/dL (ref 12.0–15.0)
Immature Granulocytes: 0 %
Lymphocytes Relative: 17 %
Lymphs Abs: 1.2 10*3/uL (ref 0.7–4.0)
MCH: 31.1 pg (ref 26.0–34.0)
MCHC: 33.9 g/dL (ref 30.0–36.0)
MCV: 91.7 fL (ref 80.0–100.0)
Monocytes Absolute: 0.4 10*3/uL (ref 0.1–1.0)
Monocytes Relative: 5 %
Neutro Abs: 5.2 10*3/uL (ref 1.7–7.7)
Neutrophils Relative %: 77 %
Platelets: 164 10*3/uL (ref 150–400)
RBC: 4.21 MIL/uL (ref 3.87–5.11)
RDW: 13.3 % (ref 11.5–15.5)
WBC: 6.9 10*3/uL (ref 4.0–10.5)
nRBC: 0 % (ref 0.0–0.2)

## 2021-02-27 LAB — LIPASE, BLOOD: Lipase: 22 U/L (ref 11–51)

## 2021-02-27 MED ORDER — LORAZEPAM 1 MG PO TABS
1.0000 mg | ORAL_TABLET | Freq: Once | ORAL | Status: AC
  Administered 2021-02-27: 1 mg via ORAL
  Filled 2021-02-27: qty 1

## 2021-02-27 MED ORDER — PREDNISONE 50 MG PO TABS
60.0000 mg | ORAL_TABLET | Freq: Once | ORAL | Status: AC
  Administered 2021-02-27: 60 mg via ORAL
  Filled 2021-02-27: qty 1

## 2021-02-27 MED ORDER — DICYCLOMINE HCL 10 MG/ML IM SOLN
20.0000 mg | Freq: Once | INTRAMUSCULAR | Status: AC
  Administered 2021-02-27: 19:00:00 20 mg via INTRAMUSCULAR
  Filled 2021-02-27: qty 2

## 2021-02-27 MED ORDER — SODIUM CHLORIDE 0.9 % IV BOLUS
500.0000 mL | Freq: Once | INTRAVENOUS | Status: AC
  Administered 2021-02-27: 19:00:00 500 mL via INTRAVENOUS

## 2021-02-27 NOTE — Discharge Instructions (Addendum)
Lab work and imaging are all reassuring.  I suspect pain is from constipation, please continue with MiraLAX 1 capful a day for the next 2 weeks.  Please continue to drink plenty of fluids as this medication only works as long as you are hydrated.  I recommend increasing your fiber intake, exercising as this will help with your symptoms.  Please follow-up with GI for further evaluation given you the info above please call  Come back to the emergency department if you develop chest pain, shortness of breath, severe abdominal pain, uncontrolled nausea, vomiting, diarrhea.

## 2021-02-27 NOTE — ED Provider Notes (Signed)
  Face-to-face evaluation   History: She presents for evaluation of abdominal and back pain with poor stooling, constipation.  She states the back pain and constipation have been present for 4 weeks.  She is using MiraLAX daily for constipation without significant relief.  She is trying to avoid using hydrocodone.  She states her back pain is constant.  It does not radiate to her legs or make walking difficult.  Physical exam: Elderly, alert and cooperative.  No dysarthria or aphasia.  He is able to stand and transfer to wheelchair.  Nontoxic appearance.  She is holding her hand to her lower back, pointing to the location of her pain.  Medical screening examination/treatment/procedure(s) were conducted as a shared visit with non-physician practitioner(s) and myself.  I personally evaluated the patient during the encounter    Daleen Bo, MD 02/28/21 1152

## 2021-02-27 NOTE — ED Triage Notes (Signed)
Pain in abdominal area and back for 4 weeks, states she has not had a good BM for weeks, also has a lot of gas

## 2021-02-27 NOTE — ED Provider Notes (Signed)
[facility] EMERGENCY DEPARTMENT Provider Note   CSN: [id number] Arrival date & time: 02/27/21  1410     History Chief Complaint  Patient presents with   Abdominal Pain    [name] is a 75 y.o. female.  HPI  Patient with significant medical history of AAA, GERD, hypertension, COPD, presents with chief complaint of generalized abdominal pain and constipation.  Patient states this has been going on for last 4 weeks, states that she has stomach pain throughout her stomach, and will sometimes feel in her back.patient describes as a pressure-like sensation, she admits that she it feels better after passes gas.  She endorses that she has been having difficulty with bowel movements, states she is having movements daily but states they are small and not her usual.  Patient states she been taking MiraLAX daily for last 2 weeks without significant relief.  She denies seeing hematochezia, dark tarry stools, she denies urinary symptoms, flank pain, nausea or vomiting.  Patient denies any relieving factors, she states she has never had this in the past.  Patient denies headaches, fevers, chills, shortness of breath or chest pain.  After reviewing patient's chart she was recently seen at [facility] ED on 06/10 for similar complaints, lab work and UA were both unremarkable, CT scan of abdomen and pelvis were negative for acute findings, CTA chest abdomen pelvis with contrast negative for acute findings, does show biliary ductal dilation GI was consulted and they recommend MRCP as an outpatient.  She was sent home. Past Medical History:  Diagnosis Date   AAA (abdominal aortic aneurysm) ([facility])    GERD (gastroesophageal reflux disease)    Hyperlipidemia    Hypertension    Moderate COPD (chronic obstructive pulmonary disease) ([facility])    Osteoporosis    Tobacco abuse    Vertigo     Patient Active Problem List   Diagnosis Date Noted   AAA (abdominal aortic aneurysm) without rupture ([place]) 09/14/2015     Past Surgical History:  Procedure Laterality Date   ABDOMINAL AORTIC ANEURYSM REPAIR       OB History   No obstetric history on file.     No family history on file.  Social History   Tobacco Use   Smoking status: Every Day    Packs/day: 1.00    Years: 50.00    Pack years: 50.00    Types: Cigarettes   Smokeless tobacco: Never  Vaping Use   Vaping Use: Never used  Substance Use Topics   Drug use: Yes    Types: Hydrocodone    Home Medications Prior to Admission medications   Medication Sig Start Date End Date Taking? Authorizing Provider  albuterol (PROVENTIL HFA;VENTOLIN HFA) 108 (90 BASE) MCG/ACT inhaler Inhale 2 puffs into the lungs every 6 (six) hours as needed for wheezing or shortness of breath.   Yes [provider]  BREO ELLIPTA 100-25 MCG/INH AEPB Inhale 1 puff into the lungs daily. 12/09/19  Yes [provider]  cyanocobalamin 1000 MCG tablet Take 1,000 mcg by mouth daily.   Yes [provider]  cycloSPORINE (RESTASIS) 0.05 % ophthalmic emulsion Place 1 drop into both eyes daily.   Yes [provider]  denosumab (PROLIA) 60 MG/ML SOSY injection Inject 60 mg into the skin every 6 (six) months.   Yes [provider]  metoprolol succinate (TOPROL-XL) 50 MG 24 hr tablet Take 50 mg by mouth daily. Take with or immediately following a meal.   Yes [provider]  pantoprazole (PROTONIX) 40 MG tablet Take 40 mg by mouth daily.   Yes [provider]  simvastatin (ZOCOR) 20 MG tablet Take 20 mg by mouth daily.   Yes [provider]  valsartan (DIOVAN) 80 MG tablet Take 80 mg by mouth daily. 09/28/20  Yes [provider]    Allergies    Codeine and Tramadol  Review of Systems   Review of Systems  Constitutional:  Negative for chills and fever.  HENT:  Negative for congestion.   Respiratory:  Negative for shortness of breath.   Cardiovascular:  Negative for chest pain.   Gastrointestinal:  Positive for abdominal pain and constipation. Negative for nausea and vomiting.  Genitourinary:  Negative for enuresis.  Musculoskeletal:  Positive for back pain.  Skin:  Negative for rash.  Neurological:  Negative for headaches.  Hematological:  Does not bruise/bleed easily.   Physical Exam Updated Vital Signs BP (!) 156/64   Pulse 78   Temp 97.8 F (36.6 C)   Resp 20   Ht _0  (1.702 m)   Wt 73.5 kg   SpO2 98%   BMI 25.37 kg/m   Physical Exam Vitals and nursing note reviewed.  Constitutional:      General: She is not in acute distress.    Appearance: She is not ill-appearing.  HENT:     Head: Normocephalic and atraumatic.     Nose: No congestion.  Eyes:     Conjunctiva/sclera: Conjunctivae normal.  Cardiovascular:     Rate and Rhythm: Normal rate and regular rhythm.     Pulses: Normal pulses.     Heart sounds: No murmur heard.   No friction rub. No gallop.  Pulmonary:     Effort: No respiratory distress.     Breath sounds: No wheezing, rhonchi or rales.  Abdominal:     General: There is distension.     Tenderness: There is no abdominal tenderness. There is no right CVA tenderness or left CVA tenderness.     Comments: Patient's abdomen was distended, tympanic to percussion, normal active bowel sounds, nontender to palpation, no guarding, rebound tenderness, peritoneal sign.  Musculoskeletal:     Right lower leg: No edema.     Left lower leg: No edema.  Skin:    General: Skin is warm and dry.  Neurological:     Mental Status: She is alert.  Psychiatric:        Mood and Affect: Mood normal.    ED Results / Procedures / Treatments   Labs (all labs ordered are listed, but only abnormal results are displayed) Labs Reviewed  COMPREHENSIVE METABOLIC PANEL - Abnormal; Notable for the following components:      Result Value   Sodium 131 (*)    CO2 21 (*)    Glucose, Bld 105 (*)    Creatinine, Ser 1.06 (*)    AST 14 (*)    GFR, Estimated 54  (*)    All other components within normal limits  URINALYSIS, ROUTINE W REFLEX MICROSCOPIC - Abnormal; Notable for the following components:   Hgb urine dipstick SMALL (*)    Ketones, ur 20 (*)    Protein, ur 100 (*)    All other components within normal limits  CBC WITH DIFFERENTIAL/PLATELET  LIPASE, BLOOD    EKG None  Radiology CT ABDOMEN PELVIS WO CONTRAST  Result Date: 02/27/2021 CLINICAL DATA:  Abdominal distension.  Abdominal and back pain. EXAM: CT ABDOMEN AND PELVIS WITHOUT CONTRAST TECHNIQUE: Multidetector CT imaging  of the abdomen and pelvis was performed following the standard protocol without IV contrast. COMPARISON:  Contrast-enhanced CTA 9 days ago 02/18/2021, noncontrast CT 02/10/2021 FINDINGS: Lower chest: Stable tortuous dilated descending aorta with outpouchings corresponding to partially thrombosed aneurysms on prior CTA. No change from prior there is no periaortic stranding. No pleural fluid or acute airspace disease. Normal heart size. Hepatobiliary: Cyst in the anterior liver is unchanged from prior exam. No new or suspicious liver lesion. Gallbladder not seen presumably surgically absent. Stable biliary prominence from prior. No choledocholithiasis. Pancreas: Parenchymal atrophy. No ductal dilatation or inflammation. Spleen: Normal in size without focal abnormality. Adrenals/Urinary Tract: Stable 11 mm left adrenal adenoma. Normal right adrenal gland. Hydronephrosis. Stable mild renal parenchymal thinning and areas of scarring. No renal calculi or focal abnormality. Unremarkable urinary bladder Stomach/Bowel: Decompressed stomach. No small bowel obstruction or inflammation. No abnormal small bowel distension. Normal appendix. Moderate stool in the ascending and transverse colon with transverse tortuosity. Decompressed descending colon. Diverticulosis involving the distal descending and sigmoid colon without acute diverticulitis. No colonic wall thickening or inflammation. No  abnormal rectal distention. Vascular/Lymphatic: Postsurgical change of the abdominal aorta without aneurysmal dilatation. Iliac atherosclerosis and tortuosity. There is no portal venous or mesenteric gas. No abdominopelvic adenopathy. Reproductive: Uterus not seen, presumably surgically absent. No adnexal mass. Other: No ascites or free air. Small fat containing supraumbilical ventral abdominal wall hernias without bowel involvement or inflammation. Musculoskeletal: Bones under mineralized. There are no acute or suspicious osseous abnormalities. Mild bilateral hip osteoarthritis. Facet hypertrophy in the lumbar spine. IMPRESSION: 1. No acute abnormality in the abdomen/pelvis. 2. Distal colonic diverticulosis without diverticulitis. Moderate stool burden in the ascending and transverse colon with tortuosity, query constipation. 3. Stable left adrenal adenoma. 4. Aorto bi-iliac atherosclerosis with stable noncontrast appearance of the tortuous descending aorta from CTA 9 days ago. Aortic Atherosclerosis (ICD10-I70.0). Electronically Signed   By: Keith Rake M.D.   On: 02/27/2021 19:04    Procedures Procedures   Medications Ordered in ED Medications  predniSONE (DELTASONE) tablet 60 mg (has no administration in time range)  LORazepam (ATIVAN) tablet 1 mg (has no administration in time range)  dicyclomine (BENTYL) injection 20 mg (20 mg Intramuscular Given 02/27/21 1831)  sodium chloride 0.9 % bolus 500 mL (500 mLs Intravenous New Bag/Given 02/27/21 1830)    ED Course  I have reviewed the triage vital signs and the nursing notes.  Pertinent labs & imaging results that were available during my care of the patient were reviewed by me and considered in my medical decision making (see chart for details).    MDM Rules/Calculators/A&P                         Initial impression-patient presents with abdominal pain x4 weeks.  She is alert, does not appear to be in acute distress, vital signs reassuring.   Will obtain basic lab work-up, provide patient fluids, pain medication and reassess.  Work-up-CBC unremarkable, CMP shows hyponatremia 131, CO2 of 21, hyperglycemia 105, creatinine 1.06, GFR 54, UA negative for nitrates, leukocytes, hematuria.  CT abdomen pelvis reveals no acute abnormalities, distal colic diverticulosis without diverticulitis, stable left adrenal adenoma.   Reassessment-patient reassessed, updated on lab and imaging, vital signs remained stable, patient agreed for discharge at this time.  Rule out-I have low suspicion for dissecting AAA as presentation is atypical, chronic pain for last 4 weeks, improved with bowel movements in flatus, CT angio 6 days ago was unremarkable.  Low suspicion for ruptured stomach ulcer as there is no peritoneal sign present on exam, no pneumoperitoneum on CT abdomen pelvis.  Low suspicion for pancreatitis as lipase is 22, low suspicion for liver or gallbladder abnormality as liver enzymes alk phos within normal limits.  Low suspicion for kidney stone, UTI, [place] as UA sent for nitrates, leukocytes, hematuria CT scans negative for acute findings.  Plan-  Stomach pain-suspect secondary due to constipation, will have her continue with MiraLAX, increase water intake and dietary fibers.  Will recommend that she follows up with GI for further evaluation. Back pain-suspect acute on chronic, will have her continue with her pain medications, follow-up with PCP as needed for further evaluation.  Vital signs have remained stable, no indication for hospital admission.  Patient discussed with attending and they agreed with assessment and plan.  Patient given at home care as well strict return precautions.  Patient verbalized that they understood agreed to said plan.  Final Clinical Impression(s) / ED Diagnoses Final diagnoses:  Generalized abdominal pain    Rx / DC Orders ED Discharge Orders     None        Aron Baba 02/27/21 2009     Daleen Bo, MD 02/28/21 1152

## 2021-03-24 ENCOUNTER — Other Ambulatory Visit: Payer: Self-pay

## 2021-03-24 ENCOUNTER — Encounter (INDEPENDENT_AMBULATORY_CARE_PROVIDER_SITE_OTHER): Payer: Self-pay | Admitting: Gastroenterology

## 2021-03-24 ENCOUNTER — Ambulatory Visit (INDEPENDENT_AMBULATORY_CARE_PROVIDER_SITE_OTHER): Payer: Medicare Other | Admitting: Gastroenterology

## 2021-03-24 DIAGNOSIS — K5903 Drug induced constipation: Secondary | ICD-10-CM | POA: Diagnosis not present

## 2021-03-24 DIAGNOSIS — Z79891 Long term (current) use of opiate analgesic: Secondary | ICD-10-CM | POA: Diagnosis not present

## 2021-03-24 DIAGNOSIS — K6389 Other specified diseases of intestine: Secondary | ICD-10-CM | POA: Diagnosis not present

## 2021-03-24 DIAGNOSIS — K5900 Constipation, unspecified: Secondary | ICD-10-CM | POA: Insufficient documentation

## 2021-03-24 DIAGNOSIS — T40601A Poisoning by unspecified narcotics, accidental (unintentional), initial encounter: Secondary | ICD-10-CM | POA: Diagnosis not present

## 2021-03-24 MED ORDER — NALOXEGOL OXALATE 12.5 MG PO TABS: 12.5000 mg | ORAL_TABLET | Freq: Every day | ORAL | 1 refills | Status: AC

## 2021-03-24 NOTE — Patient Instructions (Addendum)
Start Movantik 12.5 mg every day Stop Linzess and Miralax for now If constipation does not improve after 5 days, restart Miralax same dosage Call in 2 weeks to update status of bowel movements Try to decrease dosage of opiates

## 2021-03-24 NOTE — Progress Notes (Addendum)
[name], M.D. Gastroenterology & Hepatology [facility] [address] [place], [place] [address] Primary Care Physician: Glenda Chroman, MD [address] [place] [address]  Referring MD: PCP  Chief Complaint:  abdominal distention, pain and constipation.  History of Present Illness: [name] is a 75 y.o. female with PMH large TV adenoma s/p TAR, AAA s/p repair, HLD,  HTN, GERD, COPD, who presents for evaluation of abdominal distention, pain and constipation.  Patient reports that 6 weeks ago she presented sudden onset of constipation. She states that is having a scant amount of watery stool possibly once a week but does not feel significant relief when she passes it or when gas comes out. She has noticed that her abdomen has been getting more distended as she is accumulating more gas in her abdomen and  has not moved her bowels frequently. She is currently taking Miralax 3 capfuls every day and Linzess 290 mcg every day without any significant relief. States she used to have a BM every day prior to the onset of her symptoms.  Patients reports she has been taking hydrocodone every 6 hours for the last year, for "back spasms". States that 2 weeks ago her pain doctor increased the dosing from TID to QID dosing  Due to these symptoms she has been in the ER two times. Previous to her initial visit she had a CT abdomen with contrast which showed aneurysmal dilation of the distal thoracic aorta with stable  appearance with similar findings of multiple sites of penetrating atherosclerotic ulcers some with thrombosis. Post cholecystectomy with mild biliary duct distension  LEFT adrenal adenoma. Small fat containing ventral hernias. She went to [facility] on 02/18/21 and had a CTA chest/pelvis/abdomen which showed Interval intrahepatic and extrahepatic biliary ductal dilatation. Stable aneurysmal dilatation of the distal thoracic aorta with  multiple partially thrombosed penetrating ulcers. There was presence of occluded inferior mesenteric artery with rest of major vessels patent. Finally, she had a CT abdomen and pelvis w/o contrast on 02/27/2021 at [facility] which showed diverticulosis without diverticulitis and moderate stool burden in the colon with tortuosity.  There was stable left adrenal adenoma.  The patient denies having any nausea, vomiting, fever, chills, hematochezia, melena, hematemesis, diarrhea, jaundice, pruritus or weight loss.  Last Colonoscopy:04/2020 - Colonoscopy showed showed 2 mm polyp I cecum. Recommended repeat in 3 years.  FHx: neg for any gastrointestinal/liver disease, no malignancies Social: smokes 15 cigs a day, neg alcohol or illicit drug use Surgical: AAA surgery, cholecystectomy, c-section x2  Past Medical History: Past Medical History:  Diagnosis Date   AAA (abdominal aortic aneurysm) (HCC)    GERD (gastroesophageal reflux disease)    Hyperlipidemia    Hypertension    Moderate COPD (chronic obstructive pulmonary disease) ([place])    Osteoporosis    Tobacco abuse    Vertigo     Past Surgical History: Past Surgical History:  Procedure Laterality Date   ABDOMINAL AORTIC ANEURYSM REPAIR      Family History:History reviewed. No pertinent family history.  Social History: Social History   Tobacco Use  Smoking Status Every Day   Packs/day: 1.00   Years: 50.00   Pack years: 50.00   Types: Cigarettes  Smokeless Tobacco Never   Social History   Substance and Sexual Activity  Alcohol Use None   Social History   Substance and Sexual Activity  Drug Use Yes   Types: Hydrocodone    Allergies: Allergies  Allergen Reactions   Codeine Nausea And Vomiting   Tramadol Itching    Medications: Current Outpatient Medications  Medication Sig Dispense Refill   albuterol (PROVENTIL HFA;VENTOLIN HFA) 108 (90 BASE) MCG/ACT inhaler Inhale 2 puffs into the lungs every 6 (six) hours  as needed for wheezing or shortness of breath.     BREO ELLIPTA 100-25 MCG/INH AEPB Inhale 1 puff into the lungs daily.     cyanocobalamin 1000 MCG tablet Take 1,000 mcg by mouth daily.     cycloSPORINE (RESTASIS) 0.05 % ophthalmic emulsion Place 1 drop into both eyes daily.     denosumab (PROLIA) 60 MG/ML SOSY injection Inject 60 mg into the skin every 6 (six) months.     HYDROcodone-ibuprofen (VICOPROFEN) 7.5-200 MG tablet Take 1 tablet by mouth every 6 (six) hours as needed for moderate pain.     metoprolol succinate (TOPROL-XL) 50 MG 24 hr tablet Take 50 mg by mouth daily. Take with or immediately following a meal.     pantoprazole (PROTONIX) 40 MG tablet Take 40 mg by mouth daily.     simvastatin (ZOCOR) 20 MG tablet Take 20 mg by mouth daily.     valsartan (DIOVAN) 80 MG tablet Take 80 mg by mouth daily.     No current facility-administered medications for this visit.    Review of Systems: GENERAL: negative for malaise, night sweats HEENT: No changes in hearing or vision, no nose bleeds or other nasal problems. NECK: Negative for lumps, goiter, pain and significant neck swelling RESPIRATORY: Negative for cough, wheezing CARDIOVASCULAR: Negative for chest pain, leg swelling, palpitations, orthopnea GI: SEE HPI MUSCULOSKELETAL: Negative for joint pain or swelling, back pain, and muscle pain. SKIN: Negative for lesions, rash PSYCH: Negative for sleep disturbance, mood disorder and recent psychosocial stressors. HEMATOLOGY Negative for prolonged bleeding, bruising easily, and swollen nodes. ENDOCRINE: Negative for cold or heat intolerance, polyuria, polydipsia and goiter. NEURO: negative for tremor, gait imbalance, syncope and seizures. The remainder of the review of systems is noncontributory.   Physical Exam: BP 129/85 (BP Location: Right Arm, Patient Position: Sitting, Cuff Size: Large)   Pulse 80   Temp 98 F (36.7 C) (Oral)   Ht 5\' 7"  (1.702 m)   Wt 152 lb (68.9 kg)   BMI  23.81 kg/m  GENERAL: The patient is AO x3, in no acute distress. HEENT: Head is normocephalic and atraumatic. EOMI are intact. Mouth is well hydrated and without lesions. NECK: Supple. No masses LUNGS: Clear to auscultation. No presence of rhonchi/wheezing/rales. Adequate chest expansion HEART: RRR, normal s1 and s2. ABDOMEN: Soft, nontender, no guarding, no peritoneal signs, and nondistended. BS +. No masses. EXTREMITIES: Without any cyanosis, clubbing, rash, lesions or edema. NEUROLOGIC: AOx3, no focal motor deficit. SKIN: no jaundice, no rashes   Imaging/Labs: as above  I personally reviewed and interpreted the available labs, imaging and endoscopic files.  Impression and Plan: [name] is a 76 y.o. female with PMH large TV adenoma s/p TAR, AAA s/p repair, HLD,  HTN, GERD, COPD, who presents for evaluation of abdominal distention, pain and constipation.  Patient has presented worsening constipation without a clear identifiable etiology.  Her cross-sectional abdominal imaging has shown presence of inferior mesenteric artery decreased flow but no other alteration in other major vessels which is inconsistent with intestinal angina, and her clinical history is not consistent with this diagnosis as well.  There is no presence of any bowel obstruction in the abdominal imaging which I personally reviewed.  It  is very likely that her symptoms are related to narcotic bowel as she is taking a high dose of opiates.  I advised the patient to stop taking her current laxatives and start Movantik 12.5 mg every day.  She should ideally try to titrate down her opiate dosage.  If her constipation does not improve after 5 days, she can restart MiraLAX at the same dosage.  We will also check a TSH if her constipation does not improve.  - Start Movantik 12.5 mg every day - Stop Linzess and Miralax for now - If constipation does not improve after 5 days, restart Miralax same dosage - Call in 2 weeks to  update status of bowel movements - Try to decrease dosage of opiates  All questions were answered.      Brittney Peppers, MD Gastroenterology and Hepatology [facility]

## 2021-03-25 ENCOUNTER — Encounter (INDEPENDENT_AMBULATORY_CARE_PROVIDER_SITE_OTHER): Payer: Self-pay

## 2021-03-25 DIAGNOSIS — K5903 Drug induced constipation: Secondary | ICD-10-CM | POA: Insufficient documentation

## 2021-03-28 ENCOUNTER — Encounter (HOSPITAL_COMMUNITY): Payer: Self-pay | Admitting: *Deleted

## 2021-03-28 ENCOUNTER — Telehealth (INDEPENDENT_AMBULATORY_CARE_PROVIDER_SITE_OTHER): Payer: Self-pay

## 2021-03-28 ENCOUNTER — Emergency Department (HOSPITAL_COMMUNITY)
Admission: EM | Admit: 2021-03-28 | Discharge: 2021-03-29 | Disposition: A | Discharge status: Home / Self Care | Payer: Medicare Other | Attending: Emergency Medicine | Admitting: Emergency Medicine

## 2021-03-28 ENCOUNTER — Emergency Department (HOSPITAL_COMMUNITY): Payer: Medicare Other

## 2021-03-28 ENCOUNTER — Other Ambulatory Visit: Payer: Self-pay

## 2021-03-28 DIAGNOSIS — Z79899 Other long term (current) drug therapy: Secondary | ICD-10-CM | POA: Diagnosis not present

## 2021-03-28 DIAGNOSIS — J449 Chronic obstructive pulmonary disease, unspecified: Secondary | ICD-10-CM | POA: Diagnosis not present

## 2021-03-28 DIAGNOSIS — R110 Nausea: Secondary | ICD-10-CM | POA: Insufficient documentation

## 2021-03-28 DIAGNOSIS — R062 Wheezing: Secondary | ICD-10-CM | POA: Insufficient documentation

## 2021-03-28 DIAGNOSIS — K5900 Constipation, unspecified: Secondary | ICD-10-CM | POA: Diagnosis not present

## 2021-03-28 DIAGNOSIS — R1031 Right lower quadrant pain: Secondary | ICD-10-CM | POA: Insufficient documentation

## 2021-03-28 DIAGNOSIS — R109 Unspecified abdominal pain: Secondary | ICD-10-CM

## 2021-03-28 DIAGNOSIS — I10 Essential (primary) hypertension: Secondary | ICD-10-CM | POA: Insufficient documentation

## 2021-03-28 DIAGNOSIS — M549 Dorsalgia, unspecified: Secondary | ICD-10-CM | POA: Insufficient documentation

## 2021-03-28 DIAGNOSIS — F17210 Nicotine dependence, cigarettes, uncomplicated: Secondary | ICD-10-CM | POA: Insufficient documentation

## 2021-03-28 LAB — URINALYSIS, ROUTINE W REFLEX MICROSCOPIC
Bacteria, UA: NONE SEEN
Bilirubin Urine: NEGATIVE
Glucose, UA: NEGATIVE mg/dL
Ketones, ur: 20 mg/dL — AB
Nitrite: NEGATIVE
Protein, ur: >=300 mg/dL — AB
Specific Gravity, Urine: 1.024 (ref 1.005–1.030)
pH: 5 (ref 5.0–8.0)

## 2021-03-28 LAB — CBC
HCT: 40.1 % (ref 36.0–46.0)
Hemoglobin: 13.4 g/dL (ref 12.0–15.0)
MCH: 30.8 pg (ref 26.0–34.0)
MCHC: 33.4 g/dL (ref 30.0–36.0)
MCV: 92.2 fL (ref 80.0–100.0)
Platelets: 174 10*3/uL (ref 150–400)
RBC: 4.35 MIL/uL (ref 3.87–5.11)
RDW: 13.4 % (ref 11.5–15.5)
WBC: 8.6 10*3/uL (ref 4.0–10.5)
nRBC: 0 % (ref 0.0–0.2)

## 2021-03-28 LAB — COMPREHENSIVE METABOLIC PANEL
ALT: 14 U/L (ref 0–44)
AST: 14 U/L — ABNORMAL LOW (ref 15–41)
Albumin: 3.9 g/dL (ref 3.5–5.0)
Alkaline Phosphatase: 96 U/L (ref 38–126)
Anion gap: 10 (ref 5–15)
BUN: 13 mg/dL (ref 8–23)
CO2: 22 mmol/L (ref 22–32)
Calcium: 9.3 mg/dL (ref 8.9–10.3)
Chloride: 96 mmol/L — ABNORMAL LOW (ref 98–111)
Creatinine, Ser: 1.18 mg/dL — ABNORMAL HIGH (ref 0.44–1.00)
GFR, Estimated: 48 mL/min — ABNORMAL LOW (ref >60)
Glucose, Bld: 99 mg/dL (ref 70–99)
Potassium: 4 mmol/L (ref 3.5–5.1)
Sodium: 128 mmol/L — ABNORMAL LOW (ref 135–145)
Total Bilirubin: 1.6 mg/dL — ABNORMAL HIGH (ref 0.3–1.2)
Total Protein: 7.4 g/dL (ref 6.5–8.1)

## 2021-03-28 LAB — LIPASE, BLOOD: Lipase: 27 U/L (ref 11–51)

## 2021-03-28 MED ORDER — IOHEXOL 300 MG/ML  SOLN
100.0000 mL | Freq: Once | INTRAMUSCULAR | Status: AC | PRN
  Administered 2021-03-28: 100 mL via INTRAVENOUS

## 2021-03-28 MED ORDER — HYDROMORPHONE HCL 1 MG/ML IJ SOLN
1.0000 mg | Freq: Once | INTRAMUSCULAR | Status: AC
Start: 2021-03-28 — End: 2021-03-28
  Administered 2021-03-28: 1 mg via INTRAVENOUS
  Filled 2021-03-28: qty 1

## 2021-03-28 MED ORDER — SODIUM CHLORIDE 0.9 % IV BOLUS
1000.0000 mL | Freq: Once | INTRAVENOUS | Status: AC
  Administered 2021-03-28: 1000 mL via INTRAVENOUS

## 2021-03-28 MED ORDER — HYDROMORPHONE HCL 1 MG/ML IJ SOLN
1.0000 mg | Freq: Once | INTRAMUSCULAR | Status: AC
  Administered 2021-03-29: 1 mg via INTRAVENOUS
  Filled 2021-03-28: qty 1

## 2021-03-28 MED ORDER — ONDANSETRON HCL 4 MG/2ML IJ SOLN
4.0000 mg | Freq: Once | INTRAMUSCULAR | Status: AC
Start: 2021-03-28 — End: 2021-03-28
  Administered 2021-03-28: 4 mg via INTRAVENOUS
  Filled 2021-03-28: qty 2

## 2021-03-28 MED ORDER — FENTANYL CITRATE (PF) 100 MCG/2ML IJ SOLN
50.0000 ug | Freq: Once | INTRAMUSCULAR | Status: AC
Start: 2021-03-28 — End: 2021-03-28
  Administered 2021-03-28: 50 ug via INTRAVENOUS
  Filled 2021-03-28: qty 2

## 2021-03-28 NOTE — ED Notes (Signed)
Pt placed O2 2lnc sats 91-92%, O2 increased to 3 liters.

## 2021-03-28 NOTE — Telephone Encounter (Signed)
She will need to go to the ER for evaluation of persistent constipation and rule out an obstruction, we may need to get disimpacted.  Unfortunately, her insurance denied the approval for Movantik as they requested an appeal letter stating that she is not obstructed which will be a contraindication for the medication (patient has had 2 CT scans that have rule out an obstruction in the past).

## 2021-03-28 NOTE — ED Provider Notes (Signed)
Care assumed from Dr. Eulis Foster, Courtni Couture, PA-C, patient with chronic abdominal and back pain and negative evaluation for acute process, currently getting hydromorphone for pain control.  Anticipate discharge.  She is under the care of a pain management clinic with a pain contract, so no narcotic prescriptions will be given.  She had good pain relief with hydromorphone.  She is concerned that the underlying cause is not being addressed.  Advised that this will need to be managed through her providers on an outpatient basis.  She is discharged.   Delora Fuel, MD 65/78/46 [phone]

## 2021-03-28 NOTE — ED Provider Notes (Signed)
[facility] EMERGENCY DEPARTMENT Provider Note   CSN: [id number] Arrival date & time: 03/28/21  1659     History Chief Complaint  Patient presents with   Abdominal Pain    [name] is a 75 y.o. female.  HPI   Pt is a 75 y/o female with a h/o AAA, GERD, HLD, HTN, COPD, osteoprosis, tobacco abuse, vertigo who presents to the ED today for eval of abd pain that started about 1.5 months ago.Pain is constant and is located to the lower abdomen. Rates pain 10/10. Reports her stools have been soft. Reports associated nausea. Denies vomiting, bloody stool, fever, dysuria, frequency, or urgency.   States last BM was 3 days ago.   Past Medical History:  Diagnosis Date   AAA (abdominal aortic aneurysm) (HCC)    GERD (gastroesophageal reflux disease)    Hyperlipidemia    Hypertension    Moderate COPD (chronic obstructive pulmonary disease) (HCC)    Osteoporosis    Tobacco abuse    Vertigo     Patient Active Problem List   Diagnosis Date Noted   Drug-induced constipation 03/25/2021   Constipation 03/24/2021   Chronic prescription opiate use 03/24/2021   Narcotic bowel syndrome ([place]) 03/24/2021   AAA (abdominal aortic aneurysm) without rupture ([place]) 09/14/2015    Past Surgical History:  Procedure Laterality Date   ABDOMINAL AORTIC ANEURYSM REPAIR       OB History   No obstetric history on file.     History reviewed. No pertinent family history.  Social History   Tobacco Use   Smoking status: Every Day    Packs/day: 1.00    Years: 50.00    Pack years: 50.00    Types: Cigarettes   Smokeless tobacco: Never  Vaping Use   Vaping Use: Never used  Substance Use Topics   Drug use: Yes    Types: Hydrocodone    Home Medications Prior to Admission medications   Medication Sig Start Date End Date Taking? Authorizing Provider  albuterol (PROVENTIL HFA;VENTOLIN HFA) 108 (90 BASE) MCG/ACT inhaler Inhale 2 puffs into the lungs every 6 (six) hours as needed for  wheezing or shortness of breath.    [provider]  BREO ELLIPTA 100-25 MCG/INH AEPB Inhale 1 puff into the lungs daily. 12/09/19   [provider]  cyanocobalamin 1000 MCG tablet Take 1,000 mcg by mouth daily.    [provider]  cycloSPORINE (RESTASIS) 0.05 % ophthalmic emulsion Place 1 drop into both eyes daily.    [provider]  denosumab (PROLIA) 60 MG/ML SOSY injection Inject 60 mg into the skin every 6 (six) months.    [provider]  HYDROcodone-ibuprofen (VICOPROFEN) 7.5-200 MG tablet Take 1 tablet by mouth every 6 (six) hours as needed for moderate pain.    [provider]  linaclotide (LINZESS) 290 MCG CAPS capsule Take 290 mcg by mouth daily before breakfast.    [provider]  metoprolol succinate (TOPROL-XL) 50 MG 24 hr tablet Take 50 mg by mouth daily. Take with or immediately following a meal.    [provider]  naloxegol oxalate (MOVANTIK) 12.5 MG TABS tablet Take 1 tablet (12.5 mg total) by mouth daily. 03/24/21   Harvel Quale, MD  pantoprazole (PROTONIX) 40 MG tablet Take 40 mg by mouth daily.    [provider]  polyethylene glycol (MIRALAX / GLYCOLAX) 17 g packet Take 17 g by mouth daily. Tid    [provider]  simvastatin (ZOCOR) 20  MG tablet Take 20 mg by mouth daily.    [provider]  valsartan (DIOVAN) 80 MG tablet Take 80 mg by mouth daily. 09/28/20   [provider]    Allergies    Codeine and Tramadol  Review of Systems   Review of Systems  Constitutional:  Negative for fever.  HENT:  Negative for ear pain and sore throat.   Eyes:  Negative for visual disturbance.  Respiratory:  Negative for cough and shortness of breath.   Cardiovascular:  Negative for chest pain.  Gastrointestinal:  Positive for abdominal pain, constipation and nausea. Negative for vomiting.  Genitourinary:  Negative for dysuria, frequency, hematuria and urgency.   Musculoskeletal:  Positive for back pain.  Skin:  Negative for color change and rash.  Neurological:  Negative for seizures and syncope.  All other systems reviewed and are negative.  Physical Exam Updated Vital Signs BP (!) 182/95   Pulse (!) 119   Temp 97.9 F (36.6 C) (Oral)   Resp (!) 30   Ht 5\' 7"  (1.702 m)   Wt 68.9 kg   SpO2 93%   BMI 23.81 kg/m   Physical Exam Vitals and nursing note reviewed.  Constitutional:      General: She is not in acute distress.    Appearance: She is well-developed.  HENT:     Head: Normocephalic and atraumatic.  Eyes:     Conjunctiva/sclera: Conjunctivae normal.  Cardiovascular:     Rate and Rhythm: Normal rate and regular rhythm.     Pulses:          Dorsalis pedis pulses are 2+ on the right side and 2+ on the left side.     Heart sounds: Normal heart sounds. No murmur heard. Pulmonary:     Effort: Pulmonary effort is normal. No respiratory distress.     Breath sounds: Wheezing present. No rhonchi or rales.  Abdominal:     General: Bowel sounds are increased. There is distension.     Palpations: Abdomen is soft.     Tenderness: There is abdominal tenderness in the right lower quadrant. There is guarding.  Musculoskeletal:     Cervical back: Neck supple.  Skin:    General: Skin is warm and dry.  Neurological:     Mental Status: She is alert.    ED Results / Procedures / Treatments   Labs (all labs ordered are listed, but only abnormal results are displayed) Labs Reviewed  COMPREHENSIVE METABOLIC PANEL - Abnormal; Notable for the following components:      Result Value   Sodium 128 (*)    Chloride 96 (*)    Creatinine, Ser 1.18 (*)    AST 14 (*)    Total Bilirubin 1.6 (*)    GFR, Estimated 48 (*)    All other components within normal limits  URINALYSIS, ROUTINE W REFLEX MICROSCOPIC - Abnormal; Notable for the following components:   Color, Urine AMBER (*)    APPearance HAZY (*)    Hgb urine dipstick SMALL (*)     Ketones, ur 20 (*)    Protein, ur >=300 (*)    Leukocytes,Ua TRACE (*)    All other components within normal limits  LIPASE, BLOOD  CBC    EKG None  Radiology CT ABDOMEN PELVIS W CONTRAST  Result Date: 03/28/2021 CLINICAL DATA:  Abdominal pain x6 weeks, nausea EXAM: CT ABDOMEN AND PELVIS WITH CONTRAST TECHNIQUE: Multidetector CT imaging of the abdomen and pelvis was performed using the standard  protocol following bolus administration of intravenous contrast. CONTRAST:  133mL OMNIPAQUE IOHEXOL 300 MG/ML  SOLN COMPARISON:  CT abdomen/pelvis dated 02/27/2021. CTA chest abdomen pelvis dated 10/11/2020. FINDINGS: Lower chest: Mild scarring/atelectasis in the medial right lower lobe. Hepatobiliary: 11 mm central hepatic cyst (series 2/image 16). Status post cholecystectomy. Mild central intrahepatic ductal dilatation. Dilated common duct, measuring 12 mm (coronal image 41), smoothly tapering at the ampulla. Pancreas: Within normal limits. Spleen: Within normal limits. Adrenals/Urinary Tract: 15 mm left adrenal adenoma (series 2/image 16), benign. Right adrenal gland is within normal limits. Left renal sinus cysts and additional subcentimeter cysts bilaterally. No hydronephrosis. Bladder is within normal limits. Stomach/Bowel: Stomach is within normal limits. No evidence of bowel obstruction. Normal appendix (series 2/image 59). Sigmoid diverticulosis, without evidence of diverticulitis. Vascular/Lymphatic: Fusiform dilatation of the descending thoracic aorta with right posterolateral penetrating atherosclerotic ulcer just above the aortic hiatus (series 2/image 9), unchanged from prior CTA. No evidence of abdominal aortic aneurysm. Atherosclerotic calcifications of the abdominal aorta and branch vessels. No suspicious abdominopelvic lymphadenopathy. Reproductive: Status post hysterectomy. No adnexal masses. Other: No abdominopelvic ascites. Musculoskeletal: Visualized osseous structures are within normal  limits. IMPRESSION: No CT findings to account for the patient's chronic abdominal pain. Stable appearance of the descending thoracic aorta with penetrating atherosclerotic ulcer. Sigmoid diverticulosis, without evidence of diverticulitis. Additional stable ancillary findings as above. Electronically Signed   By: Julian Hy M.D.   On: 03/28/2021 22:13   CT L-SPINE NO CHARGE  Result Date: 03/28/2021 CLINICAL DATA:  Chronic low back EXAM: CT LUMBAR SPINE WITHOUT CONTRAST TECHNIQUE: Multidetector CT imaging of the lumbar spine was performed without intravenous contrast administration. Multiplanar CT image reconstructions were also generated. COMPARISON:  None. FINDINGS: Segmentation: 5 lumbar type vertebrae. Alignment: Normal. Vertebrae: No acute fracture or focal pathologic process. Paraspinal and other soft tissues: Calcific aortic atherosclerosis Disc levels: L4-5: Mild facet hypertrophy and ligamentum flavum infolding with small disc bulge and mild spinal canal stenosis. L5-S1: Spinal canal or neural foraminal stenosis. IMPRESSION: 1. Mild spinal canal stenosis at L4-5. Aortic Atherosclerosis (ICD10-I70.0). Electronically Signed   By: Ulyses Jarred M.D.   On: 03/28/2021 22:37    Procedures Procedures   Medications Ordered in ED Medications  HYDROmorphone (DILAUDID) injection 1 mg (has no administration in time range)  sodium chloride 0.9 % bolus 1,000 mL (0 mLs Intravenous Stopped 03/28/21 2219)  fentaNYL (SUBLIMAZE) injection 50 mcg (50 mcg Intravenous Given 03/28/21 2057)  iohexol (OMNIPAQUE) 300 MG/ML solution 100 mL (100 mLs Intravenous Contrast Given 03/28/21 2138)  HYDROmorphone (DILAUDID) injection 1 mg (1 mg Intravenous Given 03/28/21 2224)  ondansetron (ZOFRAN) injection 4 mg (4 mg Intravenous Given 03/28/21 2224)    ED Course  I have reviewed the triage vital signs and the nursing notes.  Pertinent labs & imaging results that were available during my care of the patient were  reviewed by me and considered in my medical decision making (see chart for details).    MDM Rules/Calculators/A&P                          75 year old woman presenting the emergency department today complaining of abdominal pain and back pain which she states is chronic in nature that she could not handle the pain at home so came here for further evaluation.  Has history of constipation and AAA.   Reviewed/interpreted labs CBC is unremarkable, CMP shows some hyponatremia, hypochloremia and mildly elevated creatinine, she was given a liter  of fluids.  Her LFTs are reassuring UA is nonconcerning for UTI and lipase is negative  Inform by nursing staff that patient had an episode of vomiting and her sats dropped after she returned from CT.  I evaluated the patient at bedside and discontinued supplemental O2.  She is satting at 90% and above on room air and she is not tachypneic and is not complaining of shortness of breath.  She does look uncomfortable and states that when she moved for CT and made her back pain hurt worse.  She is somewhat tachycardic now. Dr Eulis Foster was also informed about pt clinical status. He evaluated pt and ordered dilaudid and zofran.  CT of the abdomen/pelvis demonstrates some constipation but no acute process.  CT of the lumbar spine demonstrates 1. Mild spinal canal stenosis at L4-5. Aortic Atherosclerosis   On reassessment after second dose of pain medication patient states she is still in significant pain and does not feel that she can walk or be discharged home at this time.  Will order additional pain medications.  At shift change care transition to Dr. Roxanne Mins will reassess the patient, anticipate discharge home.  Final Clinical Impression(s) / ED Diagnoses Final diagnoses:  Back pain  Abdominal pain, unspecified abdominal location  Constipation, unspecified constipation type    Rx / DC Orders ED Discharge Orders     None        Bishop Dublin 03/28/21 2308    Daleen Bo, MD 03/28/21 2359

## 2021-03-28 NOTE — ED Triage Notes (Signed)
Pt c/o abdominal pain x 6 weeks; pt states she has gas and can not get rid of it

## 2021-03-28 NOTE — Telephone Encounter (Signed)
Patient called this afternoon stating she is having severe gas pains. She states she has constipation and the last time she was able to pass stool was Saturday.She says she can not take much more of this pain. She is having right lower abdominal pains into her back , she states she can barley walk and is barely able to get gas out. She is having some nausea. I advised that I would speak with Dr. Jenetta Downer, but if unable to get rid of gas she may need to go to the ED. Please advise.

## 2021-03-28 NOTE — Telephone Encounter (Signed)
Patient aware she will need to go to the Ed.She states understanding.

## 2021-03-28 NOTE — ED Provider Notes (Signed)
  Face-to-face evaluation   History: She is here for evaluation of ongoing back pain that is unrelenting, despite taking hydrocodone, under chronic pain contract.  The pain has been bothering her for couple weeks.  She also feels like she has "gas," indicating abdominal discomfort.  She states she has not had a bowel movement several days but the last time it was soft.  She denies nausea, vomiting, fever, chills.  Physical exam: Female, alert and cooperative.  She is uncomfortable.  She was evaluated after CT imaging.  Abdomen is soft and nontender to palpation.  She is able to extend both legs independently without discomfort  Medical screening examination/treatment/procedure(s) were conducted as a shared visit with non-physician practitioner(s) and myself.  I personally evaluated the patient during the encounter    Daleen Bo, MD 03/28/21 2358

## 2021-03-28 NOTE — Discharge Instructions (Addendum)
Please continue to work with your medical providers to try to control your pain as an outpatient.  Return if pain is not being adequately controlled or if you have any new or concerning symptoms.

## 2021-03-28 NOTE — ED Notes (Signed)
Pt reports sob after ct, nausea and vomiting. Pt thinks its related to lower back pain from moving during ct scan. Sats 87% on room air. PA Courtni notified.

## 2021-04-07 ENCOUNTER — Ambulatory Visit (INDEPENDENT_AMBULATORY_CARE_PROVIDER_SITE_OTHER): Payer: Medicare Other | Admitting: Gastroenterology

## 2021-04-13 ENCOUNTER — Telehealth (INDEPENDENT_AMBULATORY_CARE_PROVIDER_SITE_OTHER): Payer: Self-pay

## 2021-04-13 NOTE — Telephone Encounter (Signed)
I called and left a detailed message she may call [facility] to get the rx filled as it is approved.

## 2021-04-13 NOTE — Telephone Encounter (Signed)
Per [facility] Medicare patient [place] approved from 04/12/2021-04/12/2022. Approval letter will be mailed to the office per [facility] with [facility].

## 2021-04-13 NOTE — Telephone Encounter (Signed)
Excellent news, thanks

## 2021-04-19 ENCOUNTER — Telehealth (INDEPENDENT_AMBULATORY_CARE_PROVIDER_SITE_OTHER): Payer: Self-pay | Admitting: *Deleted

## 2021-04-19 NOTE — Telephone Encounter (Signed)
Thanks for the update Abigail Butts

## 2021-04-19 NOTE — Telephone Encounter (Signed)
Fax from [employer]. The initial decision to deny coverage for movantik 12.'5mg'$   OR tabs has been overturned on an exception basis due to additional information provided.

## 2021-05-12 DEATH — deceased

## 2021-07-07 ENCOUNTER — Ambulatory Visit (INDEPENDENT_AMBULATORY_CARE_PROVIDER_SITE_OTHER): Payer: Medicare Other | Admitting: Gastroenterology
